# Patient Record
Sex: Female | Born: 1965 | Race: White | Hispanic: No | Marital: Married | Smoking: Former smoker
Health system: Southern US, Community
[De-identification: ages and names within clinical notes are randomized; demographics above are authoritative.]

## PROBLEM LIST (undated history)

## (undated) DIAGNOSIS — T7840XA Allergy, unspecified, initial encounter: Secondary | ICD-10-CM

## (undated) DIAGNOSIS — K219 Gastro-esophageal reflux disease without esophagitis: Secondary | ICD-10-CM

## (undated) DIAGNOSIS — T148XXA Other injury of unspecified body region, initial encounter: Secondary | ICD-10-CM

## (undated) DIAGNOSIS — B009 Herpesviral infection, unspecified: Secondary | ICD-10-CM

## (undated) DIAGNOSIS — B029 Zoster without complications: Secondary | ICD-10-CM

## (undated) DIAGNOSIS — U071 COVID-19: Secondary | ICD-10-CM

## (undated) DIAGNOSIS — R569 Unspecified convulsions: Secondary | ICD-10-CM

## (undated) HISTORY — DX: Zoster without complications: B02.9

## (undated) HISTORY — DX: Herpesviral infection, unspecified: B00.9

## (undated) HISTORY — DX: Other injury of unspecified body region, initial encounter: T14.8XXA

## (undated) HISTORY — DX: Gastro-esophageal reflux disease without esophagitis: K21.9

## (undated) HISTORY — PX: ABDOMINOPLASTY: SUR9

## (undated) HISTORY — DX: Unspecified convulsions: R56.9

## (undated) HISTORY — DX: COVID-19: U07.1

## (undated) HISTORY — DX: Allergy, unspecified, initial encounter: T78.40XA

## (undated) HISTORY — PX: FOOT SURGERY: SHX648

---

## 1993-09-06 HISTORY — PX: LAPAROSCOPIC CHOLECYSTECTOMY: SUR755

## 1999-04-27 ENCOUNTER — Other Ambulatory Visit: Admission: RE | Admit: 1999-04-27 | Discharge: 1999-04-27 | Payer: Self-pay | Admitting: Gynecology

## 2001-09-06 HISTORY — PX: AUGMENTATION MAMMAPLASTY: SUR837

## 2001-09-13 ENCOUNTER — Other Ambulatory Visit: Admission: RE | Admit: 2001-09-13 | Discharge: 2001-09-13 | Payer: Self-pay | Admitting: Gynecology

## 2002-09-14 ENCOUNTER — Other Ambulatory Visit: Admission: RE | Admit: 2002-09-14 | Discharge: 2002-09-14 | Payer: Self-pay | Admitting: Gynecology

## 2002-11-07 ENCOUNTER — Emergency Department (HOSPITAL_COMMUNITY): Admission: EM | Admit: 2002-11-07 | Discharge: 2002-11-07 | Payer: Self-pay | Admitting: Emergency Medicine

## 2002-11-07 ENCOUNTER — Encounter: Payer: Self-pay | Admitting: Emergency Medicine

## 2003-09-07 HISTORY — PX: ORIF ANKLE FRACTURE: SUR919

## 2004-07-07 ENCOUNTER — Other Ambulatory Visit: Admission: RE | Admit: 2004-07-07 | Discharge: 2004-07-07 | Payer: Self-pay | Admitting: Gynecology

## 2004-10-11 ENCOUNTER — Ambulatory Visit (HOSPITAL_BASED_OUTPATIENT_CLINIC_OR_DEPARTMENT_OTHER): Admission: RE | Admit: 2004-10-11 | Discharge: 2004-10-11 | Payer: Self-pay | Admitting: Family Medicine

## 2004-10-11 ENCOUNTER — Ambulatory Visit: Payer: Self-pay | Admitting: Internal Medicine

## 2005-07-21 ENCOUNTER — Other Ambulatory Visit: Admission: RE | Admit: 2005-07-21 | Discharge: 2005-07-21 | Payer: Self-pay | Admitting: Gynecology

## 2006-08-15 ENCOUNTER — Other Ambulatory Visit: Admission: RE | Admit: 2006-08-15 | Discharge: 2006-08-15 | Payer: Self-pay | Admitting: Gynecology

## 2008-09-10 ENCOUNTER — Ambulatory Visit: Payer: Self-pay | Admitting: Gynecology

## 2008-09-10 ENCOUNTER — Encounter: Payer: Self-pay | Admitting: Gynecology

## 2008-09-10 ENCOUNTER — Other Ambulatory Visit: Admission: RE | Admit: 2008-09-10 | Discharge: 2008-09-10 | Payer: Self-pay | Admitting: Gynecology

## 2008-09-18 ENCOUNTER — Ambulatory Visit: Payer: Self-pay | Admitting: Gynecology

## 2008-09-24 ENCOUNTER — Ambulatory Visit: Payer: Self-pay | Admitting: Gynecology

## 2008-10-29 ENCOUNTER — Ambulatory Visit: Payer: Self-pay | Admitting: Gynecology

## 2010-06-23 ENCOUNTER — Other Ambulatory Visit: Admission: RE | Admit: 2010-06-23 | Discharge: 2010-06-23 | Payer: Self-pay | Admitting: Gynecology

## 2010-06-23 ENCOUNTER — Ambulatory Visit: Payer: Self-pay | Admitting: Gynecology

## 2010-09-07 ENCOUNTER — Ambulatory Visit
Admission: RE | Admit: 2010-09-07 | Discharge: 2010-09-07 | Payer: Self-pay | Source: Home / Self Care | Attending: Gynecology | Admitting: Gynecology

## 2010-09-09 ENCOUNTER — Ambulatory Visit
Admission: RE | Admit: 2010-09-09 | Discharge: 2010-09-09 | Payer: Self-pay | Source: Home / Self Care | Attending: Gynecology | Admitting: Gynecology

## 2010-09-11 ENCOUNTER — Ambulatory Visit
Admission: RE | Admit: 2010-09-11 | Discharge: 2010-09-11 | Payer: Self-pay | Source: Home / Self Care | Attending: Gynecology | Admitting: Gynecology

## 2010-09-17 ENCOUNTER — Ambulatory Visit: Admit: 2010-09-17 | Payer: Self-pay | Admitting: Gynecology

## 2010-10-12 ENCOUNTER — Ambulatory Visit: Payer: Self-pay | Admitting: Gynecology

## 2010-10-23 ENCOUNTER — Ambulatory Visit: Payer: Self-pay | Admitting: Gynecology

## 2011-01-22 NOTE — Procedures (Signed)
NAME:  Alexandra Coffey, Alexandra Coffey              ACCOUNT NO.:  [id number]   MEDICAL RECORD NO.:  [id number]          PATIENT TYPE:  OUT   LOCATION:  SLEEP CENTER                 FACILITY:  [facility]   PHYSICIAN:  Clinton D. Maple Hudson, M.D. DATE OF BIRTH:  30-Jan-1966   DATE OF STUDY:  10/11/2004                              NOCTURNAL POLYSOMNOGRAM   STUDY DATE:  October 11, 2004   REFERRING PHYSICIAN:  Quita Skye. Artis Flock, M.D.   INDICATION FOR STUDY:  Insomnia with sleep apnea.   EPWORTH SLEEPINESS SCORE:  1/0.   BMI:  24.   WEIGHT:  145 pounds.   SLEEP ARCHITECTURE:  Short total sleep time 256 minutes with sleep  efficiency 69%.  Stage I 16%, stage II 52%, stages III and IV 20%.  REM was  13% of total sleep time.  Sleep latency 16 minutes, REM latency 180 minutes.  Awake after sleep onset was 101 minutes.  Arousal index 32.  The patient  took trazodone and Singulair at bedtime.  No increased slow wave/delta sleep  stages III and IV.  It is unusual in an adult and may reflect rebound sleep  deficiency.  She indicated this night of sleep quality same as usual  indicating habitual expectations to sleep 4 to 5 hours.   RESPIRATORY DATA:  Respiratory disturbance index (RDI) 11.5 per hour  indicating mild obstructive sleep apnea/hypopnea syndrome.  This included 1  obstructive apnea and 48 hypopnea's.  Events were not positional.  REM RDI  was absent.   OXYGEN DATA:  Mild snoring with oxygen desaturation to a nadir of 85%.  Mean  oxygen saturation through the study was 97% on room air.   CARDIAC DATA:  Normal sinus rhythm.   MOVEMENTS/PARASOMNIA:  Occasional leg jerk with insignificant effect on  sleep.   IMPRESSION/RECOMMENDATION:  1.  Mild obstructive sleep apnea/hypopnea syndrome, RDI 11.5 per hour with      oxygen desaturation to 85%.  This may deserve to be treated and a trial      of CPAP would be considered if weight loss, attention to nasal      congestion, and sleep position on flat of her  back are not sufficient.      It is not clear that this mild degree of sleep apnea significantly      explains the patient's difficulty initiating and maintaining sleep which      is more consistent with a primary insomnia.  Does sleep history offer      evidence of unusual anxiety, depression or      lifestyle issue that would impact on sleep quality?  Successful medical      therapy may combine an antidepressant and a hypnotic medication in      addition for the treatment for sleep apnea.      CDY/MEDQ  D:  10/25/2004 10:27:28  T:  10/25/2004 16:10:96  Job:  [id number]

## 2011-03-01 ENCOUNTER — Ambulatory Visit (INDEPENDENT_AMBULATORY_CARE_PROVIDER_SITE_OTHER): Payer: Managed Care, Other (non HMO) | Admitting: Gynecology

## 2011-03-01 DIAGNOSIS — N390 Urinary tract infection, site not specified: Secondary | ICD-10-CM

## 2011-03-01 DIAGNOSIS — R82998 Other abnormal findings in urine: Secondary | ICD-10-CM

## 2011-03-01 DIAGNOSIS — R350 Frequency of micturition: Secondary | ICD-10-CM

## 2012-03-08 ENCOUNTER — Encounter: Payer: Self-pay | Admitting: Gynecology

## 2012-03-08 ENCOUNTER — Ambulatory Visit (INDEPENDENT_AMBULATORY_CARE_PROVIDER_SITE_OTHER): Payer: 59 | Admitting: Gynecology

## 2012-03-08 VITALS — BP 126/80 | Ht 65.5 in | Wt 158.0 lb

## 2012-03-08 DIAGNOSIS — R635 Abnormal weight gain: Secondary | ICD-10-CM

## 2012-03-08 DIAGNOSIS — Z01419 Encounter for gynecological examination (general) (routine) without abnormal findings: Secondary | ICD-10-CM

## 2012-03-08 DIAGNOSIS — Z30432 Encounter for removal of intrauterine contraceptive device: Secondary | ICD-10-CM

## 2012-03-08 DIAGNOSIS — N951 Menopausal and female climacteric states: Secondary | ICD-10-CM

## 2012-03-08 DIAGNOSIS — Z833 Family history of diabetes mellitus: Secondary | ICD-10-CM

## 2012-03-08 LAB — CBC WITH DIFFERENTIAL/PLATELET
Basophils Absolute: 0 10*3/uL (ref 0.0–0.1)
Basophils Relative: 1 % (ref 0–1)
MCHC: 34 g/dL (ref 30.0–36.0)
Neutro Abs: 4.5 10*3/uL (ref 1.7–7.7)
Neutrophils Relative %: 57 % (ref 43–77)
Platelets: 215 10*3/uL (ref 150–400)
RDW: 13.7 % (ref 11.5–15.5)

## 2012-03-08 NOTE — Progress Notes (Signed)
Alexandra Coffey 1966/04/21 [id number]   History:    46 y.o.  for annual gyn exam with a complaint of weight gain and wanted to have her Mirena IUD removed. The Mirena IUD was placed in 2012. She's also having some mild vasomotor symptoms. Her last gynecological examination was in 2011. Patient's last mammogram was 8 years ago. Patient does her monthly self breast examination. Last year she was weighing 164 and is down to 158. Her BMI is 25.89. Patient works out regularly.  Past medical history,surgical history, family history and social history were all reviewed and documented in the EPIC chart.  Gynecologic History No LMP recorded. Patient is not currently having periods (Reason: IUD). Contraception: condoms and IUD Last Pap: 2011. Results were: normal Last mammogram: 8 years ago. Results were: normal  Obstetric History OB History    Grav Para Term Preterm Abortions TAB SAB Ect Mult Living   2 2 2       2      # Outc Date GA Lbr Len/2nd Wgt Sex Del Anes PTL Lv   1 TRM     F CS  No Yes   2 TRM     F CS  No Yes       ROS: A ROS was performed and pertinent positives and negatives are included in the history.  GENERAL: No fevers or chills. HEENT: No change in vision, no earache, sore throat or sinus congestion. NECK: No pain or stiffness. CARDIOVASCULAR: No chest pain or pressure. No palpitations. PULMONARY: No shortness of breath, cough or wheeze. GASTROINTESTINAL: No abdominal pain, nausea, vomiting or diarrhea, melena or bright red blood per rectum. GENITOURINARY: No urinary frequency, urgency, hesitancy or dysuria. MUSCULOSKELETAL: No joint or muscle pain, no back pain, no recent trauma. DERMATOLOGIC: No rash, no itching, no lesions. ENDOCRINE: No polyuria, polydipsia, no heat or cold intolerance. No recent change in weight. HEMATOLOGICAL: No anemia or easy bruising or bleeding. NEUROLOGIC: No headache, seizures, numbness, tingling or weakness. PSYCHIATRIC: No depression, no loss of interest  in normal activity or change in sleep pattern.     Exam: chaperone present  BP 126/80  Ht 5' 5.5" (1.664 m)  Wt 158 lb (71.668 kg)  BMI 25.89 kg/m2  Body mass index is 25.89 kg/(m^2).  General appearance : Well developed well nourished female. No acute distress HEENT: Neck supple, trachea midline, no carotid bruits, no thyroidmegaly Lungs: Clear to auscultation, no rhonchi or wheezes, or rib retractions  Heart: Regular rate and rhythm, no murmurs or gallops Breast:Examined in sitting and supine position were symmetrical in appearance, no palpable masses or tenderness,  no skin retraction, no nipple inversion, no nipple discharge, no skin discoloration, no axillary or supraclavicular lymphadenopathy Abdomen: no palpable masses or tenderness, no rebound or guarding Extremities: no edema or skin discoloration or tenderness  Pelvic:  Bartholin, Urethra, Skene Glands: Within normal limits             Vagina: No gross lesions or discharge  Cervix: No gross lesions or discharge, IUD string seen  Uterus  anteverted, normal size, shape and consistency, non-tender and mobile  Adnexa  Without masses or tenderness  Anus and perineum  normal   Rectovaginal  normal sphincter tone without palpated masses or tenderness             Hemoccult not done   The cervix was cleansed with Betadine solution and a [place] clamp was utilized to retrieve the IUD by pulling on the string showing it to  the patient and discarded.  Assessment/Plan:  46 y.o. female for annual exam was unremarkable exam. Due to patient's family history diabetes a hemoglobin A1c will be done today. As a result of her vasomotor symptoms an FSH will be drawn today along with a TSH because of concerns of weight gain as well. We'll also check her CBC as well as a screening cholesterol and urinalysis. No Pap smear done today. New screening guidelines discussed. Patient was provided with a requisition to schedule her  mammogram.    Ok Edwards MD, 5:26 PM 03/08/2012

## 2012-03-08 NOTE — Patient Instructions (Addendum)
Health Maintenance, Females A healthy lifestyle and preventative care can promote health and wellness.  Maintain regular health, dental, and eye exams.   Eat a healthy diet. Foods like vegetables, fruits, whole grains, low-fat dairy products, and lean protein foods contain the nutrients you need without too many calories. Decrease your intake of foods high in solid fats, added sugars, and salt. Get information about a proper diet from your caregiver, if necessary.   Regular physical exercise is one of the most important things you can do for your health. Most adults should get at least 150 minutes of moderate-intensity exercise (any activity that increases your heart rate and causes you to sweat) each week. In addition, most adults need muscle-strengthening exercises on 2 or more days a week.    Maintain a healthy weight. The body mass index (BMI) is a screening tool to identify possible weight problems. It provides an estimate of body fat based on height and weight. Your caregiver can help determine your BMI, and can help you achieve or maintain a healthy weight. For adults 20 years and older:   A BMI below 18.5 is considered underweight.   A BMI of 18.5 to 24.9 is normal.   A BMI of 25 to 29.9 is considered overweight.   A BMI of 30 and above is considered obese.   Maintain normal blood lipids and cholesterol by exercising and minimizing your intake of saturated fat. Eat a balanced diet with plenty of fruits and vegetables. Blood tests for lipids and cholesterol should begin at age 20 and be repeated every 5 years. If your lipid or cholesterol levels are high, you are over 50, or you are a high risk for heart disease, you may need your cholesterol levels checked more frequently.Ongoing high lipid and cholesterol levels should be treated with medicines if diet and exercise are not effective.   If you smoke, find out from your caregiver how to quit. If you do not use tobacco, do not start.    If you are pregnant, do not drink alcohol. If you are breastfeeding, be very cautious about drinking alcohol. If you are not pregnant and choose to drink alcohol, do not exceed 1 drink per day. One drink is considered to be 12 ounces (355 mL) of beer, 5 ounces (148 mL) of wine, or 1.5 ounces (44 mL) of liquor.   Avoid use of street drugs. Do not share needles with anyone. Ask for help if you need support or instructions about stopping the use of drugs.   High blood pressure causes heart disease and increases the risk of stroke. Blood pressure should be checked at least every 1 to 2 years. Ongoing high blood pressure should be treated with medicines, if weight loss and exercise are not effective.   If you are 55 to 48 years old, ask your caregiver if you should take aspirin to prevent strokes.   Diabetes screening involves taking a blood sample to check your fasting blood sugar level. This should be done once every 3 years, after age 45, if you are within normal weight and without risk factors for diabetes. Testing should be considered at a younger age or be carried out more frequently if you are overweight and have at least 1 risk factor for diabetes.   Breast cancer screening is essential preventative care for women. You should practice "breast self-awareness." This means understanding the normal appearance and feel of your breasts and may include breast self-examination. Any changes detected, no matter how   small, should be reported to a caregiver. Women in their 20s and 30s should have a clinical breast exam (CBE) by a caregiver as part of a regular health exam every 1 to 3 years. After age 40, women should have a CBE every year. Starting at age 40, women should consider having a mammogram (breast X-ray) every year. Women who have a family history of breast cancer should talk to their caregiver about genetic screening. Women at a high risk of breast cancer should talk to their caregiver about having  an MRI and a mammogram every year.   The Pap test is a screening test for cervical cancer. Women should have a Pap test starting at age 21. Between ages 21 and 29, Pap tests should be repeated every 2 years. Beginning at age 30, you should have a Pap test every 3 years as long as the past 3 Pap tests have been normal. If you had a hysterectomy for a problem that was not cancer or a condition that could lead to cancer, then you no longer need Pap tests. If you are between ages 65 and 70, and you have had normal Pap tests going back 10 years, you no longer need Pap tests. If you have had past treatment for cervical cancer or a condition that could lead to cancer, you need Pap tests and screening for cancer for at least 20 years after your treatment. If Pap tests have been discontinued, risk factors (such as a new sexual partner) need to be reassessed to determine if screening should be resumed. Some women have medical problems that increase the chance of getting cervical cancer. In these cases, your caregiver may recommend more frequent screening and Pap tests.   The human papillomavirus (HPV) test is an additional test that may be used for cervical cancer screening. The HPV test looks for the virus that can cause the cell changes on the cervix. The cells collected during the Pap test can be tested for HPV. The HPV test could be used to screen women aged 30 years and older, and should be used in women of any age who have unclear Pap test results. After the age of 30, women should have HPV testing at the same frequency as a Pap test.   Colorectal cancer can be detected and often prevented. Most routine colorectal cancer screening begins at the age of 50 and continues through age 75. However, your caregiver may recommend screening at an earlier age if you have risk factors for colon cancer. On a yearly basis, your caregiver may provide home test kits to check for hidden blood in the stool. Use of a small camera at  the end of a tube, to directly examine the colon (sigmoidoscopy or colonoscopy), can detect the earliest forms of colorectal cancer. Talk to your caregiver about this at age 50, when routine screening begins. Direct examination of the colon should be repeated every 5 to 10 years through age 75, unless early forms of pre-cancerous polyps or small growths are found.   Hepatitis C blood testing is recommended for all people born from 1945 through 1965 and any individual with known risks for hepatitis C.   Practice safe sex. Use condoms and avoid high-risk sexual practices to reduce the spread of sexually transmitted infections (STIs). Sexually active women aged 25 and younger should be checked for Chlamydia, which is a common sexually transmitted infection. Older women with new or multiple partners should also be tested for Chlamydia. Testing for other   STIs is recommended if you are sexually active and at increased risk.   Osteoporosis is a disease in which the bones lose minerals and strength with aging. This can result in serious bone fractures. The risk of osteoporosis can be identified using a bone density scan. Women ages 21 and over and women at risk for fractures or osteoporosis should discuss screening with their caregivers. Ask your caregiver whether you should be taking a calcium supplement or vitamin D to reduce the rate of osteoporosis.   Menopause can be associated with physical symptoms and risks. Hormone replacement therapy is available to decrease symptoms and risks. You should talk to your caregiver about whether hormone replacement therapy is right for you.   Use sunscreen with a sun protection factor (SPF) of 30 or greater. Apply sunscreen liberally and repeatedly throughout the day. You should seek shade when your shadow is shorter than you. Protect yourself by wearing long sleeves, pants, a wide-brimmed hat, and sunglasses year round, whenever you are outdoors.   Notify your caregiver  of new moles or changes in moles, especially if there is a change in shape or color. Also notify your caregiver if a mole is larger than the size of a pencil eraser.   Stay current with your immunizations.  Document Released: 03/08/2011 Document Revised: 08/12/2011 Document Reviewed: 03/08/2011 [facility] Patient Information 2012 [place], [place].  Perimenopause Perimenopause is the time when your body begins to move into the menopause (no menstrual period for 12 straight months). It is a natural process. Perimenopause can begin 2 to 8 years before the menopause and usually lasts for one year after the menopause. During this time, your ovaries may or may not produce an egg. The ovaries vary in their production of estrogen and progesterone hormones each month. This can cause irregular menstrual periods, difficulty in getting pregnant, vaginal bleeding between periods and uncomfortable symptoms. CAUSES  Irregular production of the ovarian hormones, estrogen and progesterone, and not ovulating every month.   Other causes include:   Tumor of the pituitary gland in the brain.   Medical disease that affects the ovaries.   Radiation treatment.   Chemotherapy.   Unknown causes.   Heavy smoking and excessive alcohol intake can bring on perimenopause sooner.  SYMPTOMS   Hot flashes.   Night sweats.   Irregular menstrual periods.   Decrease sex drive.   Vaginal dryness.   Headaches.   Mood swings.   Depression.   Memory problems.   Irritability.   Tiredness.   Weight gain.   Trouble getting pregnant.   The beginning of losing bone cells (osteoporosis).   The beginning of hardening of the arteries (atherosclerosis).  DIAGNOSIS  Your caregiver will make a diagnosis by analyzing your age, menstrual history and your symptoms. They will do a physical exam noting any changes in your body, especially your female organs. Female hormone tests may or may not be helpful depending on  the amount and when you produce the female hormones. However, other hormone tests may be helpful (ex. thyroid hormone) to rule out other problems. TREATMENT  The decision to treat during the perimenopause should be made by you and your caregiver depending on how the symptoms are affecting you and your life style. There are various treatments available such as:  Treating individual symptoms with a specific medication for that symptom (ex. tranquilizer for depression).   Herbal medications that can help specific symptoms.   Counseling.   Group therapy.   No treatment.  HOME CARE INSTRUCTIONS   Before seeing your caregiver, make a list of your menstrual periods (when the occur, how heavy they are, how long between periods and how long they last), your symptoms and when they started.   Take the medication as recommended by your caregiver.   Sleep and rest.   Exercise.   Eat a diet that contains calcium (good for your bones) and soy (acts like estrogen hormone).   Do not smoke.   Avoid alcoholic beverages.   Taking vitamin E may help in certain cases.   Take calcium and vitamin D supplements to help prevent bone loss.   Group therapy is sometimes helpful.   Acupuncture may help in some cases.  SEEK MEDICAL CARE IF:   You have any of the above and want to know if it is perimenopause.   You want advice and treatment for any of your symptoms mentioned above.   You need a referral to a specialist (gynecologist, psychiatrist or psychologist).  SEEK IMMEDIATE MEDICAL CARE IF:   You have vaginal bleeding.   Your period lasts longer than 8 days.   You periods are recurring sooner than 21 days.   You have bleeding after intercourse.   You have severe depression.   You have pain when you urinate.   You have severe headaches.   You develop vision problems.  Document Released: 09/30/2004 Document Revised: 08/12/2011 Document Reviewed: 06/20/2008 [facility] Patient  Information 2012 [place], [place].

## 2012-03-09 LAB — HEMOGLOBIN A1C: Mean Plasma Glucose: 111 mg/dL (ref <117)

## 2012-03-09 LAB — FOLLICLE STIMULATING HORMONE: FSH: 41.5 m[IU]/mL

## 2012-03-10 LAB — URINALYSIS W MICROSCOPIC + REFLEX CULTURE
Bilirubin Urine: NEGATIVE
Casts: NONE SEEN
Crystals: NONE SEEN
Ketones, ur: NEGATIVE mg/dL
Specific Gravity, Urine: <1.005 — ABNORMAL LOW (ref 1.005–1.030)
Urobilinogen, UA: 0.2 mg/dL (ref 0.0–1.0)

## 2012-03-12 LAB — URINE CULTURE: Colony Count: 10000

## 2012-05-15 ENCOUNTER — Telehealth: Payer: Self-pay | Admitting: Gynecology

## 2012-05-15 NOTE — Telephone Encounter (Signed)
Patient needs an office appointment for consultation.

## 2012-05-15 NOTE — Telephone Encounter (Signed)
Patient said she had IUD removed in July because of weight gain.  She said that since then her weight has not dropped.  She said she has tried diet and exercise with no weight loss.  She is requesting prescription.  She said you gave her an RX in the past that she took for just a couple of months to "jump start my metabolism" and help her lose weight. Cannot wear any of her fall clothes.  She would like that again.  I did not see it in her e-chart or hardcopy chart.  (Hardcopy chart is in your inbox in the hall.)

## 2012-05-16 NOTE — Telephone Encounter (Signed)
Patient informed and consult scheduled.

## 2012-05-18 ENCOUNTER — Ambulatory Visit (INDEPENDENT_AMBULATORY_CARE_PROVIDER_SITE_OTHER): Payer: 59 | Admitting: Gynecology

## 2012-05-18 ENCOUNTER — Encounter: Payer: Self-pay | Admitting: Gynecology

## 2012-05-18 VITALS — BP 128/84 | Wt 165.0 lb

## 2012-05-18 DIAGNOSIS — N951 Menopausal and female climacteric states: Secondary | ICD-10-CM

## 2012-05-18 DIAGNOSIS — R635 Abnormal weight gain: Secondary | ICD-10-CM

## 2012-05-18 DIAGNOSIS — R6882 Decreased libido: Secondary | ICD-10-CM

## 2012-05-18 DIAGNOSIS — R232 Flushing: Secondary | ICD-10-CM

## 2012-05-18 DIAGNOSIS — Z780 Asymptomatic menopausal state: Secondary | ICD-10-CM

## 2012-05-18 MED ORDER — PHENTERMINE HCL 37.5 MG PO CAPS: 37.5000 mg | ORAL_CAPSULE | ORAL | Status: DC

## 2012-05-18 NOTE — Patient Instructions (Addendum)
Phentermine tablets or capsules  What is this medicine? PHENTERMINE (FEN ter meen) decreases your appetite. It is used with a reduced calorie diet and exercise to help you lose weight. This medicine may be used for other purposes; ask your health care provider or pharmacist if you have questions. What should I tell my health care provider before I take this medicine? They need to know if you have any of these conditions: -agitation -glaucoma -heart disease -high blood pressure -history of substance abuse -lung disease called Primary Pulmonary Hypertension (PPH) -taken an MAOI like Carbex, Eldepryl, Marplan, Nardil, or Parnate in last 14 days -thyroid disease -an unusual or allergic reaction to phentermine, other medicines, foods, dyes, or preservatives -pregnant or trying to get pregnant -breast-feeding How should I use this medicine? Take this medicine by mouth with a glass of water. Follow the directions on the prescription label. This medicine is usually taken 30 minutes before or 1 to 2 hours after breakfast. Avoid taking this medicine in the evening. It may interfere with sleep. Take your doses at regular intervals. Do not take your medicine more often than directed. Talk to your pediatrician regarding the use of this medicine in children. Special care may be needed. Overdosage: If you think you have taken too much of this medicine contact a poison control center or emergency room at once. NOTE: This medicine is only for you. Do not share this medicine with others. What if I miss a dose? If you miss a dose, take it as soon as you can. If it is almost time for your next dose, take only that dose. Do not take double or extra doses. What may interact with this medicine? Do not take this medicine with any of the following medications: -duloxetine -MAOIs like Carbex, Eldepryl, Marplan, Nardil, and Parnate -medicines for colds or breathing difficulties like pseudoephedrine or  phenylephrine -procarbazine -sibutramine -SSRIs like citalopram, escitalopram, fluoxetine, fluvoxamine, paroxetine, and sertraline -stimulants like dexmethylphenidate, methylphenidate or modafinil -venlafaxine This medicine may also interact with the following medications: -medicines for diabetes This list may not describe all possible interactions. Give your health care provider a list of all the medicines, herbs, non-prescription drugs, or dietary supplements you use. Also tell them if you smoke, drink alcohol, or use illegal drugs. Some items may interact with your medicine. What should I watch for while using this medicine? Notify your physician immediately if you become short of breath while doing your normal activities. Do not take this medicine within 6 hours of bedtime. It can keep you from getting to sleep. Avoid drinks that contain caffeine and try to stick to a regular bedtime every night. This medicine was intended to be used in addition to a healthy diet and exercise. The best results are achieved this way. This medicine is only indicated for short-term use. Eventually your weight loss may level out. At that point, the drug will only help you maintain your new weight. Do not increase or in any way change your dose without consulting your doctor. You may get drowsy or dizzy. Do not drive, use machinery, or do anything that needs mental alertness until you know how this medicine affects you. Do not stand or sit up quickly, especially if you are an older patient. This reduces the risk of dizzy or fainting spells. Alcohol may increase dizziness and drowsiness. Avoid alcoholic drinks. What side effects may I notice from receiving this medicine? Side effects that you should report to your doctor or health care professional as  soon as possible: -chest pain, palpitations -depression or severe changes in mood -increased blood pressure -irritability -nervousness or restlessness -severe  dizziness -shortness of breath -problems urinating -unusual swelling of the legs -vomiting Side effects that usually do not require medical attention (report to your doctor or health care professional if they continue or are bothersome): -blurred vision or other eye problems -changes in sexual ability or desire -constipation or diarrhea -difficulty sleeping -dry mouth or unpleasant taste -headache -nausea This list may not describe all possible side effects. Call your doctor for medical advice about side effects. You may report side effects to FDA at 1-800-FDA-1088. Where should I keep my medicine? Keep out of the reach of children. This medicine can be abused. Keep your medicine in a safe place to protect it from theft. Do not share this medicine with anyone. Selling or giving away this medicine is dangerous and against the law. Store at room temperature between 20 and 25 degrees C (68 and 77 degrees F). Keep container tightly closed. Throw away any unused medicine after the expiration date. NOTE: This sheet is a summary. It may not cover all possible information. If you have questions about this medicine, talk to your doctor, pharmacist, or health care provider.  2012, [employer]. (10/07/2010 11:02:44 AM)  Menopause Menopause is the normal time of life when menstrual periods stop completely. Menopause is complete when you have missed 12 consecutive menstrual periods. It usually occurs between the ages of 88 to 100, with an average age of 54. Very rarely does a woman develop menopause before 48 years old. At menopause, your ovaries stop producing the female hormones, estrogen and progesterone. This can cause undesirable symptoms and also affect your health. Sometimes the symptoms may occur 4 to 5 years before the menopause begins. There is no relationship between menopause and:  Oral contraceptives.   Number of children you had.   Race.   The age your menstrual periods started  (menarche).  Heavy smokers and very thin women may develop menopause earlier in life. CAUSES  The ovaries stop producing the female hormones estrogen and progesterone.   Other causes include:   Surgery to remove both ovaries.   The ovaries stop functioning for no known reason.   Tumors of the pituitary gland in the brain.   Medical disease that affects the ovaries and hormone production.   Radiation treatment to the abdomen or pelvis.   Chemotherapy that affects the ovaries.  SYMPTOMS   Hot flashes.   Night sweats.   Decrease in sex drive.   Vaginal dryness and thinning of the vagina causing painful intercourse.   Dryness of the skin and developing wrinkles.   Headaches.   Tiredness.   Irritability.   Memory problems.   Weight gain.   Bladder infections.   Hair growth of the face and chest.   Infertility.  More serious symptoms include:  Loss of bone (osteoporosis) causing breaks (fractures).   Depression.   Hardening and narrowing of the arteries (atherosclerosis) causing heart attacks and strokes.  DIAGNOSIS   When the menstrual periods have stopped for 12 straight months.   Physical exam.   Hormone studies of the blood.  TREATMENT  There are many treatment choices and nearly as many questions about them. The decisions to treat or not to treat menopausal changes is an individual choice made with your caregiver. Your caregiver can discuss the treatments with you. Together, you can decide which treatment will work best for you. Your treatment  choices may include:   Hormone therapy (estorgen and progesterone).   Non-hormonal medications.   Treating the individual symptoms with medication (for example antidepressants for depression).   Herbal medications that may help specific symptoms.   Counseling by a psychiatrist or psychologist.   Group therapy.   Lifestyle changes including:   Eating healthy.   Regular exercise.   Limiting caffeine  and alcohol.   Stress management and meditation.   No treatment.  HOME CARE INSTRUCTIONS   Take the medication your caregiver gives you as directed.   Get plenty of sleep and rest.   Exercise regularly.   Eat a diet that contains calcium (good for the bones) and soy products (acts like estrogen hormone).   Avoid alcoholic beverages.   Do not smoke.   If you have hot flashes, dress in layers.   Take supplements, calcium and vitamin D to strengthen bones.   You can use over-the-counter lubricants or moisturizers for vaginal dryness.   Group therapy is sometimes very helpful.   Acupuncture may be helpful in some cases.  SEEK MEDICAL CARE IF:   You are not sure you are in menopause.   You are having menopausal symptoms and need advice and treatment.   You are still having menstrual periods after age 49.   You have pain with intercourse.   Menopause is complete (no menstrual period for 12 months) and you develop vaginal bleeding.   You need a referral to a specialist (gynecologist, psychiatrist or psychologist) for treatment.  SEEK IMMEDIATE MEDICAL CARE IF:   You have severe depression.   You have excessive vaginal bleeding.   You fell and think you have a broken bone.   You have pain when you urinate.   You develop leg or chest pain.   You have a fast pounding heart beat (palpitations).   You have severe headaches.   You develop vision problems.   You feel a lump in your breast.   You have abdominal pain or severe indigestion.  Document Released: 11/13/2003 Document Revised: 08/12/2011 Document Reviewed: 06/20/2008 [facility] Patient Information 2012 [place], [place].  Hormone Therapy At menopause, your body begins making less estrogen and progesterone hormones. This causes the body to stop having menstrual periods. This is because estrogen and progesterone hormones control your periods and menstrual cycle. A lack of estrogen may cause symptoms such  as:  Hot flushes (or hot flashes).   Vaginal dryness.   Dry skin.   Loss of sex drive.   Risk of bone loss (osteoporosis).  When this happens, you may choose to take hormone therapy to get back the estrogen lost during menopause. When the hormone estrogen is given alone, it is usually referred to as ET (Estrogen Therapy). When the hormone progestin is combined with estrogen, it is generally called HT (Hormone Therapy). This was formerly known as hormone replacement therapy (HRT). Your caregiver can help you make a decision on what will be best for you. The decision to use HT seems to change often as new studies are done. Many studies do not agree on the benefits of hormone replacement therapy. LIKELY BENEFITS OF HT INCLUDE PROTECTION FROM:  Hot Flushes (also called hot flashes) - A hot flush is a sudden feeling of heat that spreads over the face and body. The skin may redden like a blush. It is connected with sweats and sleep disturbance. Women going through menopause may have hot flushes a few times a month or several times per day depending  on the woman.   Osteoporosis (bone loss)- Estrogen helps guard against bone loss. After menopause, a woman's bones slowly lose calcium and become weak and brittle. As a result, bones are more likely to break. The hip, wrist, and spine are affected most often. Hormone therapy can help slow bone loss after menopause. Weight bearing exercise and taking calcium with vitamin D also can help prevent bone loss. There are also medications that your caregiver can prescribe that can help prevent osteoporosis.   Vaginal Dryness - Loss of estrogen causes changes in the vagina. Its lining may become thin and dry. These changes can cause pain and bleeding during sexual intercourse. Dryness can also lead to infections. This can cause burning and itching. (Vaginal estrogen treatment can help relieve pain, itching, and dryness.)   Urinary Tract Infections are more common  after menopause because of lack of estrogen. Some women also develop urinary incontinence because of low estrogen levels in the vagina and bladder.   Possible other benefits of estrogen include a positive effect on mood and short-term memory in women.  RISKS AND COMPLICATIONS  Using estrogen alone without progesterone causes the lining of the uterus to grow. This increases the risk of lining of the uterus (endometrial) cancer. Your caregiver should give another hormone called progestin if you have a uterus.   Women who take combined (estrogen and progestin) HT appear to have an increased risk of breast cancer. The risk appears to be small, but increases throughout the time that HT is taken.   Combined therapy also makes the breast tissue slightly denser which makes it harder to read mammograms (breast X-rays).   Combined, estrogen and progesterone therapy can be taken together every day, in which case there may be spotting of blood. HT therapy can be taken cyclically in which case you will have menstrual periods. Cyclically means HT is taken for a set amount of days, then not taken, then this process is repeated.   HT may increase the risk of stroke, heart attack, breast cancer and forming blood clots in your leg.   Transdermal estrogen (estrogen that is absorbed through the skin with a patch or a cream) may have more positive results with:   Cholesterol.   Blood pressure.   Blood clots.  Having the following conditions may indicate you should not have HT:  Endometrial cancer.   Liver disease.   Breast cancer.   Heart disease.   History of blood clots.   Stroke.  TREATMENT   If you choose to take HT and have a uterus, usually estrogen and progestin are prescribed.   Your caregiver will help you decide the best way to take the medications.   Possible ways to take estrogen include:   Pills.   Patches.   Gels.   Sprays.   Vaginal estrogen cream, rings and tablets.    It is best to take the lowest dose possible that will help your symptoms and take them for the shortest period of time that you can.   Hormone therapy can help relieve some of the problems (symptoms) that affect women at menopause. Before making a decision about HT, talk to your caregiver about what is best for you. Be well informed and comfortable with your decisions.  HOME CARE INSTRUCTIONS   Follow your caregivers advice when taking the medications.   A Pap test is done to screen for cervical cancer.   The first Pap test should be done at age 65.   Between  ages 45 and 50, Pap tests are repeated every 2 years.   Beginning at age 42, you are advised to have a Pap test every 3 years as long as your past 3 Pap tests have been normal.   Some women have medical problems that increase the chance of getting cervical cancer. Talk to your caregiver about these problems. It is especially important to talk to your caregiver if a new problem develops soon after your last Pap test. In these cases, your caregiver may recommend more frequent screening and Pap tests.   The above recommendations are the same for women who have or have not gotten the vaccine for HPV (Human Papillomavirus).   If you had a hysterectomy for a problem that was not a cancer or a condition that could lead to cancer, then you no longer need Pap tests. However, even if you no longer need a Pap test, a regular exam is a good idea to make sure no other problems are starting.    If you are between ages 32 and 69, and you have had normal Pap tests going back 10 years, you no longer need Pap tests. However, even if you no longer need a Pap test, a regular exam is a good idea to make sure no other problems are starting.    If you have had past treatment for cervical cancer or a condition that could lead to cancer, you need Pap tests and screening for cancer for at least 20 years after your treatment.   If Pap tests have been  discontinued, risk factors (such as a new sexual partner) need to be re-assessed to determine if screening should be resumed.   Some women may need screenings more often if they are at high risk for cervical cancer.   Get mammograms done as per the advice of your caregiver.  SEEK IMMEDIATE MEDICAL CARE IF:  You develop abnormal vaginal bleeding.   You have pain or swelling in your legs, shortness of breath, or chest pain.   You develop dizziness or headaches.   You have lumps or changes in your breasts or armpits.   You have slurred speech.   You develop weakness or numbness of your arms or legs.   You have pain, burning, or bleeding when urinating.   You develop abdominal pain.  Document Released: 05/22/2003 Document Revised: 08/12/2011 Document Reviewed: 09/09/2010 [facility] Patient Information 2012 [place], [place].

## 2012-05-18 NOTE — Progress Notes (Addendum)
Patient is a 46 year old gravida 2 para 2 who was seen in the office in July 3 for her annual gynecological examination and was requesting to remove her Mirena IUD. She thought that this is causing her to gaining weight. She was having mild vasomotor symptoms. The IUD was removed and she presented today stating that she has continued to gain weight whereby on the last office visit in July she was weighing 158 pounds and today's 165 pounds. She exercises frequently with a trainer and has also seen a nutritionist.  Her last labs included the following: Blood sugar, total cholesterol, CBC, TSH, hemoglobin A1c which were all normal. She was treated for urinary tract infection. Her FSH was found to be elevated at 41.5.  She has stated that she started to experience vasomotor symptoms and has had decreased libido and has been irritable at times. Several years ago she had been placed on phentermine 37.5 mg daily for 3 months along with exercise and diet and lost weight. She would like to return back to the phentermine for 3 months.  Exam: Neck: No thyromegaly no carotid bruits Lungs: Clear to auscultation Rogers or wheezes Heart: Regular rate and rhythm no murmurs or gallops  Assessment/plan: Patient anxious because despite exercise and diet she has continued to gain weight despite all normal labs noted above. She would like to go on phentermine for 3 months to lose weight. Her BMI was 27.04 (165 pounds). We went through a lengthy discussion on potential side effects of phentermine to include the following: Cardiac ischemia, tachycardia, valvular heart disease, hypertension, pulmonary hypertension were discussed. She fully understands and accepts. She'll be started on 37.5 mg one tablet by mouth daily for 3 months only. She'll return to the office monthly for blood pressure checks and cardiac and pulmonary auscultation. If she develops any the above symptoms she should report to the office or the emergency room  immediately. She states that her vasomotor symptoms are not that bad she wants to wait. We discussed alternatives to hormone replacement therapy such as SSRI and she will let us know. Literature information on the menopause and hormone replacement therapy as well as on phentermine were provided. When she returned back for office visit she will receive the dTap Vaccine since she does not recall having had one. Since patient's last menstrual period was in August and has not had any intercourse I did explain to her that being perimenopausal she could get pregnant and when she does have intercourse to use condoms.

## 2012-05-24 ENCOUNTER — Encounter: Payer: Self-pay | Admitting: Gynecology

## 2012-06-16 ENCOUNTER — Encounter: Payer: Self-pay | Admitting: Gynecology

## 2012-06-16 ENCOUNTER — Ambulatory Visit (INDEPENDENT_AMBULATORY_CARE_PROVIDER_SITE_OTHER): Payer: 59 | Admitting: Gynecology

## 2012-06-16 VITALS — BP 118/72 | Wt 161.0 lb

## 2012-06-16 DIAGNOSIS — R635 Abnormal weight gain: Secondary | ICD-10-CM

## 2012-06-16 NOTE — Progress Notes (Signed)
Patient is a 46 year old gravida 2 para 2 who was seen in the office in July 3 for her annual gynecological examination and was requesting to remove her Mirena IUD. She thought that this is causing her to gaining weight. She was having mild vasomotor symptoms. The IUD was removed and she presented today stating that she has continued to gain weight whereby on the last office visit in July she was weighing 158 pounds and today's 165 pounds. She exercises frequently with a trainer and has also seen a nutritionist.   Her last labs included the following: Blood sugar, total cholesterol, CBC, TSH, hemoglobin A1c which were all normal. She was treated for urinary tract infection. Her FSH was found to be elevated at 41.5.  She has stated that she started to experience vasomotor symptoms and has had decreased libido and has been irritable at times. Several years ago she had been placed on phentermine 37.5 mg daily for 3 months along with exercise and diet and lost weight. She would like to return back to the phentermine for 3 months. On September 12 she was placed on phentermine 37.5 mg 1 by mouth daily for 3 months. She presented today for followup visit for cardiac auscultation as well as blood pressure reading and wait measurement. We had gone through a lengthy discussion on potential side effects of phentermine to include the following: Cardiac ischemia, tachycardia, valvular heart disease, hypertension, pulmonary hypertension and she freely accepted these risk for a short term treatment.  She was weighing 165 pounds on July 12 today she is down to 161 pounds. She is totally asymptomatic and is continuing to work out and exercise regularly.  Exam: Blood pressure 118/72 heart rate 70 respiration 12 HEENT: Unremarkable Neck: Supple trachea midline no carotid bruits no thyromegaly Lungs: Clear to auscultation Rogers or wheezes Heart: Regular rate and rhythm no murmurs or gallops  Patient will continue on the  phentermine for 2 more months. Once again the risk route line she fully except. She will continue with her good nutrition and regular exercise. We'll see her back in 2 months for final followup. She was reminded to contact the office or report to the emergency room if she has any symptoms whatsoever such as shortness of breath or chest pain.  Patient anxious because despite exercise and diet she has continued to gain weight despite all normal labs,

## 2013-03-29 ENCOUNTER — Other Ambulatory Visit (HOSPITAL_COMMUNITY)
Admission: RE | Admit: 2013-03-29 | Discharge: 2013-03-29 | Disposition: A | Discharge status: Home / Self Care | Payer: 59 | Source: Ambulatory Visit | Attending: Gynecology | Admitting: Gynecology

## 2013-03-29 ENCOUNTER — Ambulatory Visit (INDEPENDENT_AMBULATORY_CARE_PROVIDER_SITE_OTHER): Payer: 59 | Admitting: Gynecology

## 2013-03-29 ENCOUNTER — Encounter: Payer: Self-pay | Admitting: Gynecology

## 2013-03-29 VITALS — BP 128/78 | Ht 65.5 in | Wt 173.0 lb

## 2013-03-29 DIAGNOSIS — R635 Abnormal weight gain: Secondary | ICD-10-CM

## 2013-03-29 DIAGNOSIS — Z1151 Encounter for screening for human papillomavirus (HPV): Secondary | ICD-10-CM | POA: Insufficient documentation

## 2013-03-29 DIAGNOSIS — Z01419 Encounter for gynecological examination (general) (routine) without abnormal findings: Secondary | ICD-10-CM | POA: Insufficient documentation

## 2013-03-29 DIAGNOSIS — Z780 Asymptomatic menopausal state: Secondary | ICD-10-CM

## 2013-03-29 DIAGNOSIS — Z23 Encounter for immunization: Secondary | ICD-10-CM

## 2013-03-29 DIAGNOSIS — R140 Abdominal distension (gaseous): Secondary | ICD-10-CM

## 2013-03-29 DIAGNOSIS — N951 Menopausal and female climacteric states: Secondary | ICD-10-CM

## 2013-03-29 DIAGNOSIS — R141 Gas pain: Secondary | ICD-10-CM

## 2013-03-29 DIAGNOSIS — Z1159 Encounter for screening for other viral diseases: Secondary | ICD-10-CM

## 2013-03-29 LAB — HEMOGLOBIN A1C
Hgb A1c MFr Bld: 5.2 % (ref <5.7)
Mean Plasma Glucose: 103 mg/dL (ref <117)

## 2013-03-29 LAB — COMPREHENSIVE METABOLIC PANEL
AST: 16 U/L (ref 0–37)
Albumin: 4.1 g/dL (ref 3.5–5.2)
Alkaline Phosphatase: 38 U/L — ABNORMAL LOW (ref 39–117)
BUN: 12 mg/dL (ref 6–23)
Potassium: 4.1 mEq/L (ref 3.5–5.3)

## 2013-03-29 LAB — THYROID PANEL WITH TSH
Free Thyroxine Index: 2.9 (ref 1.0–3.9)
TSH: 1.685 u[IU]/mL (ref 0.350–4.500)

## 2013-03-29 LAB — HEPATITIS C ANTIBODY: HCV Ab: NEGATIVE

## 2013-03-29 LAB — CHOLESTEROL, TOTAL: Cholesterol: 170 mg/dL (ref 0–200)

## 2013-03-29 NOTE — Addendum Note (Signed)
Addended by: Bertram Savin A on: 03/29/2013 02:13 PM   Modules accepted: Orders

## 2013-03-29 NOTE — Patient Instructions (Addendum)

## 2013-03-29 NOTE — Progress Notes (Signed)
Alexandra Coffey 17-Jul-1966 [id number]   History:    47 y.o.  for annual gyn exam whose only complaint has been weight gain. She saw her primary physician and she was started on Belviq which she takes twice a day to help lose weight which he started 2 weeks ago. No lab work was drawn. Review of her record indicated in 2013 her FSH was elevated but her vasomotor symptoms are very few and far and in between A. She is not want to go on any hormonal replacement therapy. Patient's last Pap smear was in 2011. Her last mammogram was in 2005 and she has not had a followup mammogram since then. She does do monthly breast examination. She was recently treated for a body rash By her primary physician. Patient has been complaining of bloating although her bowel movements are reported to be normal she denies any hematochezia. Review of her records indicated that she was weighing 158 pounds and is now up to 165 pounds. She exercises at least 4 times a week and is watching her diet. Patient has not received a Tdap vaccine.  Past medical history,surgical history, family history and social history were all reviewed and documented in the EPIC chart.  Gynecologic History Patient's last menstrual period was 03/12/2013. Contraception: post menopausal status Last Pap: 2011. Results were: normal Last mammogram: 2005. Results were: normal  Obstetric History OB History   Grav Para Term Preterm Abortions TAB SAB Ect Mult Living   2 2 2       2      # Outc Date GA Lbr Len/2nd Wgt Sex Del Anes PTL Lv   1 TRM     F CS  No Yes   2 TRM     F CS  No Yes       ROS: A ROS was performed and pertinent positives and negatives are included in the history.  GENERAL: No fevers or chills. HEENT: No change in vision, no earache, sore throat or sinus congestion. NECK: No pain or stiffness. CARDIOVASCULAR: No chest pain or pressure. No palpitations. PULMONARY: No shortness of breath, cough or wheeze. GASTROINTESTINAL: No abdominal pain,  nausea, vomiting or diarrhea, melena or bright red blood per rectum. GENITOURINARY: No urinary frequency, urgency, hesitancy or dysuria. MUSCULOSKELETAL: No joint or muscle pain, no back pain, no recent trauma. DERMATOLOGIC: No rash, no itching, no lesions. ENDOCRINE: No polyuria, polydipsia, no heat or cold intolerance. No recent change in weight. HEMATOLOGICAL: No anemia or easy bruising or bleeding. NEUROLOGIC: No headache, seizures, numbness, tingling or weakness. PSYCHIATRIC: No depression, no loss of interest in normal activity or change in sleep pattern.     Exam: chaperone present  BP 128/78  Ht 5' 5.5" (1.664 m)  Wt 173 lb (78.472 kg)  BMI 28.34 kg/m2  LMP 03/12/2013  Body mass index is 28.34 kg/(m^2).  General appearance : Well developed well nourished female. No acute distress HEENT: Neck supple, trachea midline, no carotid bruits, no thyroidmegaly Lungs: Clear to auscultation, no rhonchi or wheezes, or rib retractions  Heart: Regular rate and rhythm, no murmurs or gallops Breast:Examined in sitting and supine position were symmetrical in appearance, no palpable masses or tenderness,  no skin retraction, no nipple inversion, no nipple discharge, no skin discoloration, no axillary or supraclavicular lymphadenopathy Abdomen: no palpable masses or tenderness, no rebound or guarding Extremities: no edema or skin discoloration or tenderness  Pelvic:  Bartholin, Urethra, Skene Glands: Within normal limits  Vagina: No gross lesions or discharge  Cervix: No gross lesions or discharge  Uterus  Somewhat limited due to vaginismus Adnexa  Limited due to vaginismus Anus and perineum  normal   Rectovaginal  normal sphincter tone without palpated masses or tenderness             Hemoccult not done     Assessment/Plan:  47 y.o. female for annual exam complaining of weight gain and abdominal bloating. Patient's primary physician recently placed her on Belviq twice a day. The  following labs were ordered today: CBC, screen cholesterol, hemoglobin A1c, thyroid panel, comprehensive metabolic panel and urinalysis as well as Pap smear. Patient will schedule a mammogram which is long-overdue. Due to limitations on pelvic exam today and symptoms of bloating an ultrasound will be ordered here in the office in the next couple of weeks. She received a Tdap vaccine and literature and information was provided.  New CDC guidelines is recommending patients be tested once in her lifetime for hepatitis C antibody who were born between [birth year] through 1967. This was discussed with the patient today and has agreed to be tested today.  Patient was reminded of the importance of calcium and vitamin D intake and uterine or exercise.    Ok Edwards MD, 1:04 PM 03/29/2013

## 2013-03-30 LAB — CBC WITH DIFFERENTIAL/PLATELET
Basophils Absolute: 0 10*3/uL (ref 0.0–0.1)
Basophils Relative: 1 % (ref 0–1)
HCT: 41.2 % (ref 36.0–46.0)
MCHC: 33.7 g/dL (ref 30.0–36.0)
Monocytes Absolute: 0.4 10*3/uL (ref 0.1–1.0)
Neutro Abs: 3.4 10*3/uL (ref 1.7–7.7)
Neutrophils Relative %: 58 % (ref 43–77)
RDW: 13.7 % (ref 11.5–15.5)

## 2013-04-02 ENCOUNTER — Encounter: Payer: Self-pay | Admitting: Gynecology

## 2013-04-13 ENCOUNTER — Ambulatory Visit (INDEPENDENT_AMBULATORY_CARE_PROVIDER_SITE_OTHER): Payer: 59 | Admitting: Gynecology

## 2013-04-13 ENCOUNTER — Ambulatory Visit (INDEPENDENT_AMBULATORY_CARE_PROVIDER_SITE_OTHER): Payer: 59

## 2013-04-13 DIAGNOSIS — D259 Leiomyoma of uterus, unspecified: Secondary | ICD-10-CM

## 2013-04-13 DIAGNOSIS — N951 Menopausal and female climacteric states: Secondary | ICD-10-CM

## 2013-04-13 DIAGNOSIS — R143 Flatulence: Secondary | ICD-10-CM

## 2013-04-13 DIAGNOSIS — R140 Abdominal distension (gaseous): Secondary | ICD-10-CM

## 2013-04-13 NOTE — Progress Notes (Signed)
Patient presented to the office today for ultrasound due to the fact that the time of her annual exam in July 24 her pelvic exam was somewhat difficult due to her vaginismus. Patient is menopausal with minimal vasomotor symptoms and is not on any hormone replacement therapy. She is currently on Belviq which she takes twice a day for weight reduction prescribed by another provider. She has had no side effects and stated that in the past month she has lost 3 pounds. She does exercise on a regular basis. Her recent CBC, comprehensive metabolic panel, TSH, and lipid profile were all normal.  Ultrasound today: Uterus measured 10.7 x 6.9 x 5.6 cm with endometrial stripe of 7.2 mm. Patient had 5 small fibroids the largest one measuring 17 x 12 mm.  Assessment /plan: Patient perimenopausal minimal vasomotor symptoms. Normal ultrasound normal recent labs. Normal mammogram recently. Patient scheduled to return back in 1 year or when necessary.

## 2014-04-03 ENCOUNTER — Encounter: Payer: Self-pay | Admitting: Gynecology

## 2014-04-03 ENCOUNTER — Ambulatory Visit (INDEPENDENT_AMBULATORY_CARE_PROVIDER_SITE_OTHER): Payer: BC Managed Care – PPO | Admitting: Gynecology

## 2014-04-03 VITALS — BP 124/76 | Ht 65.5 in | Wt 185.0 lb

## 2014-04-03 DIAGNOSIS — R635 Abnormal weight gain: Secondary | ICD-10-CM

## 2014-04-03 DIAGNOSIS — Z780 Asymptomatic menopausal state: Secondary | ICD-10-CM

## 2014-04-03 DIAGNOSIS — Z01419 Encounter for gynecological examination (general) (routine) without abnormal findings: Secondary | ICD-10-CM

## 2014-04-03 DIAGNOSIS — Z833 Family history of diabetes mellitus: Secondary | ICD-10-CM

## 2014-04-03 DIAGNOSIS — N951 Menopausal and female climacteric states: Secondary | ICD-10-CM

## 2014-04-03 NOTE — Progress Notes (Signed)
Alexandra Coffey June 29, 1966 [id number]   History:    48 y.o.  for annual gyn exam who is asymptomatic today. Patient is menopausal and had visited a facility in [facility] by the name of Alexandra Coffey. Patient brought blood work before and after treatment consisting of normal thyroid panel, elevated FSH and decreased estradiol and decreased testosterone level and was started on a treatment of "subcutaneous estrogen/testosterone pellet". Her followup one month later [facility] had dropped to normal range and serum testosterone level was elevated at 290 and estradiol level had risen to 51.9 as a result of the treatment. She is also taking progesterone orally 200 mg daily she states that she feels well as having no menstrual cycles. Review her records indicated that she was weighing 173 pounds last year and is up to 185 pounds now.  Patient with no previous history of abnormal Pap smear. She has not had a baseline colonoscopy as of yet. She is due for her mammogram  Past medical history,surgical history, family history and social history were all reviewed and documented in the EPIC chart.  Gynecologic History Patient's last menstrual period was 03/11/2014. Contraception: post menopausal status Last Pap: 2014. Results were: normal Last mammogram: 2014. Results were: normal  Obstetric History OB History  Gravida Para Term Preterm AB SAB TAB Ectopic Multiple Living  2 2 2       2     # Outcome Date GA Lbr Len/2nd Weight Sex Delivery Anes PTL Lv  2 TRM     F CS  N Y  1 TRM     F CS  N Y       ROS: A ROS was performed and pertinent positives and negatives are included in the history.  GENERAL: No fevers or chills. HEENT: No change in vision, no earache, sore throat or sinus congestion. NECK: No pain or stiffness. CARDIOVASCULAR: No chest pain or pressure. No palpitations. PULMONARY: No shortness of breath, cough or wheeze. GASTROINTESTINAL: No abdominal pain,  nausea, vomiting or diarrhea, melena or bright red blood per rectum. GENITOURINARY: No urinary frequency, urgency, hesitancy or dysuria. MUSCULOSKELETAL: No joint or muscle pain, no back pain, no recent trauma. DERMATOLOGIC: No rash, no itching, no lesions. ENDOCRINE: No polyuria, polydipsia, no heat or cold intolerance. No recent change in weight. HEMATOLOGICAL: No anemia or easy bruising or bleeding. NEUROLOGIC: No headache, seizures, numbness, tingling or weakness. PSYCHIATRIC: No depression, no loss of interest in normal activity or change in sleep pattern.     Exam: chaperone present  BP 124/76  Ht 5' 5.5" (1.664 m)  Wt 185 lb (83.915 kg)  BMI 30.31 kg/m2  LMP 03/11/2014  Body mass index is 30.31 kg/(m^2).  General appearance : Well developed well nourished female. No acute distress HEENT: Neck supple, trachea midline, no carotid bruits, no thyroidmegaly Lungs: Clear to auscultation, no rhonchi or wheezes, or rib retractions  Heart: Regular rate and rhythm, no murmurs or gallops Breast:Examined in sitting and supine position were symmetrical in appearance, no palpable masses or tenderness,  no skin retraction, no nipple inversion, no nipple discharge, no skin discoloration, no axillary or supraclavicular lymphadenopathy Abdomen: no palpable masses or tenderness, no rebound or guarding Extremities: no edema or skin discoloration or tenderness  Pelvic:  Bartholin, Urethra, Skene Glands: Within normal limits             Vagina: No gross lesions or discharge  Cervix: No gross lesions or discharge  Uterus  anteverted, normal size, shape and consistency, non-tender and mobile  Adnexa  Without masses or tenderness  Anus and perineum  normal   Rectovaginal  normal sphincter tone without palpated masses or tenderness             Hemoccult colonoscopy this year     Assessment/Plan:  48 y.o. female for annual exam who is under the list take medical treatment for menopause. We had a  lengthy discussion of the concerns that I have in relation  to estrogen, testosterone and progesterone daily and risk of breast cancer.I have recommended that she take the progesterone 12 days of the month to decrease her risk/expoasure. She needs to schedule her mammogram and colonoscopy. She will return later in the week for fasting labs.  Note: This dictation was prepared with  Dragon/digital dictation along withSmart phrase technology. Any transcriptional errors that result from this process are unintentional.   Terrance Mass MD, 11:00 AM 04/03/2014

## 2014-04-03 NOTE — Patient Instructions (Signed)
Colonoscopy  A colonoscopy is an exam to look at the entire large intestine (colon). This exam can help find problems such as tumors, polyps, inflammation, and areas of bleeding. The exam takes about 1 hour.   LET YOUR HEALTH CARE PROVIDER KNOW ABOUT:   · Any allergies you have.  · All medicines you are taking, including vitamins, herbs, eye drops, creams, and over-the-counter medicines.  · Previous problems you or members of your family have had with the use of anesthetics.  · Any blood disorders you have.  · Previous surgeries you have had.  · Medical conditions you have.  RISKS AND COMPLICATIONS   Generally, this is a safe procedure. However, as with any procedure, complications can occur. Possible complications include:  · Bleeding.  · Tearing or rupture of the colon wall.  · Reaction to medicines given during the exam.  · Infection (rare).  BEFORE THE PROCEDURE   · Ask your health care provider about changing or stopping your regular medicines.  · You may be prescribed an oral bowel prep. This involves drinking a large amount of medicated liquid, starting the day before your procedure. The liquid will cause you to have multiple loose stools until your stool is almost clear or light green. This cleans out your colon in preparation for the procedure.  · Do not eat or drink anything else once you have started the bowel prep, unless your health care provider tells you it is safe to do so.  · Arrange for someone to drive you home after the procedure.  PROCEDURE   · You will be given medicine to help you relax (sedative).  · You will lie on your side with your knees bent.  · A long, flexible tube with a light and camera on the end (colonoscope) will be inserted through the rectum and into the colon. The camera sends video back to a computer screen as it moves through the colon. The colonoscope also releases carbon dioxide gas to inflate the colon. This helps your health care provider see the area better.  · During  the exam, your health care provider may take a small tissue sample (biopsy) to be examined under a microscope if any abnormalities are found.  · The exam is finished when the entire colon has been viewed.  AFTER THE PROCEDURE   · Do not drive for 24 hours after the exam.  · You may have a small amount of blood in your stool.  · You may pass moderate amounts of gas and have mild abdominal cramping or bloating. This is caused by the gas used to inflate your colon during the exam.  · Ask when your test results will be ready and how you will get your results. Make sure you get your test results.  Document Released: 08/20/2000 Document Revised: 06/13/2013 Document Reviewed: 04/30/2013  [employer]® Patient Information ©2015 [employer]. This information is not intended to replace advice given to you by your health care provider. Make sure you discuss any questions you have with your health care provider.

## 2014-04-08 ENCOUNTER — Other Ambulatory Visit: Payer: BC Managed Care – PPO

## 2014-04-08 DIAGNOSIS — Z833 Family history of diabetes mellitus: Secondary | ICD-10-CM

## 2014-04-08 DIAGNOSIS — R635 Abnormal weight gain: Secondary | ICD-10-CM

## 2014-04-08 DIAGNOSIS — Z01419 Encounter for gynecological examination (general) (routine) without abnormal findings: Secondary | ICD-10-CM

## 2014-04-08 LAB — CBC WITH DIFFERENTIAL/PLATELET
Basophils Absolute: 0.1 10*3/uL (ref 0.0–0.1)
Basophils Relative: 1 % (ref 0–1)
EOS PCT: 2 % (ref 0–5)
Eosinophils Absolute: 0.1 10*3/uL (ref 0.0–0.7)
HCT: 41.6 % (ref 36.0–46.0)
Hemoglobin: 14.1 g/dL (ref 12.0–15.0)
LYMPHS ABS: 2 10*3/uL (ref 0.7–4.0)
Lymphocytes Relative: 31 % (ref 12–46)
MCH: 29.6 pg (ref 26.0–34.0)
MCHC: 33.9 g/dL (ref 30.0–36.0)
MCV: 87.2 fL (ref 78.0–100.0)
MONO ABS: 0.6 10*3/uL (ref 0.1–1.0)
Monocytes Relative: 9 % (ref 3–12)
Neutro Abs: 3.6 10*3/uL (ref 1.7–7.7)
Neutrophils Relative %: 57 % (ref 43–77)
PLATELETS: 256 10*3/uL (ref 150–400)
RBC: 4.77 MIL/uL (ref 3.87–5.11)
RDW: 14 % (ref 11.5–15.5)
WBC: 6.3 10*3/uL (ref 4.0–10.5)

## 2014-04-08 LAB — COMPREHENSIVE METABOLIC PANEL
ALT: 18 U/L (ref 0–35)
AST: 16 U/L (ref 0–37)
Albumin: 4.1 g/dL (ref 3.5–5.2)
Alkaline Phosphatase: 50 U/L (ref 39–117)
BILIRUBIN TOTAL: 0.3 mg/dL (ref 0.2–1.2)
BUN: 11 mg/dL (ref 6–23)
CO2: 24 meq/L (ref 19–32)
CREATININE: 0.75 mg/dL (ref 0.50–1.10)
Calcium: 9 mg/dL (ref 8.4–10.5)
Chloride: 108 mEq/L (ref 96–112)
Glucose, Bld: 92 mg/dL (ref 70–99)
Potassium: 5.2 mEq/L (ref 3.5–5.3)
Sodium: 141 mEq/L (ref 135–145)
Total Protein: 6.4 g/dL (ref 6.0–8.3)

## 2014-04-08 LAB — LIPID PANEL
Cholesterol: 163 mg/dL (ref 0–200)
HDL: 37 mg/dL — AB (ref >39)
LDL Cholesterol: 104 mg/dL — ABNORMAL HIGH (ref 0–99)
TRIGLYCERIDES: 112 mg/dL (ref <150)
Total CHOL/HDL Ratio: 4.4 Ratio
VLDL: 22 mg/dL (ref 0–40)

## 2014-04-15 ENCOUNTER — Encounter: Payer: Self-pay | Admitting: Gynecology

## 2014-07-08 ENCOUNTER — Encounter: Payer: Self-pay | Admitting: Gynecology

## 2014-10-30 ENCOUNTER — Encounter: Payer: Self-pay | Admitting: Family Medicine

## 2015-02-26 ENCOUNTER — Encounter: Payer: Self-pay | Admitting: Gastroenterology

## 2015-04-18 ENCOUNTER — Encounter: Payer: Self-pay | Admitting: Gynecology

## 2015-04-29 ENCOUNTER — Encounter: Payer: Self-pay | Admitting: Gastroenterology

## 2015-05-05 ENCOUNTER — Encounter: Payer: Self-pay | Admitting: Gynecology

## 2015-05-05 ENCOUNTER — Ambulatory Visit (INDEPENDENT_AMBULATORY_CARE_PROVIDER_SITE_OTHER): Payer: BLUE CROSS/BLUE SHIELD | Admitting: Gynecology

## 2015-05-05 VITALS — BP 130/86 | Ht 65.0 in | Wt 185.0 lb

## 2015-05-05 DIAGNOSIS — K219 Gastro-esophageal reflux disease without esophagitis: Secondary | ICD-10-CM

## 2015-05-05 DIAGNOSIS — Z01419 Encounter for gynecological examination (general) (routine) without abnormal findings: Secondary | ICD-10-CM

## 2015-05-05 DIAGNOSIS — N898 Other specified noninflammatory disorders of vagina: Secondary | ICD-10-CM | POA: Diagnosis not present

## 2015-05-05 DIAGNOSIS — Z79890 Hormone replacement therapy (postmenopausal): Secondary | ICD-10-CM

## 2015-05-05 DIAGNOSIS — Z780 Asymptomatic menopausal state: Secondary | ICD-10-CM

## 2015-05-05 LAB — WET PREP FOR TRICH, YEAST, CLUE: Trich, Wet Prep: NONE SEEN

## 2015-05-05 MED ORDER — OMEPRAZOLE MAGNESIUM 20 MG PO TBEC: 20.0000 mg | DELAYED_RELEASE_TABLET | Freq: Every day | ORAL | Status: DC

## 2015-05-05 MED ORDER — TINIDAZOLE 500 MG PO TABS: ORAL_TABLET | ORAL | Status: DC

## 2015-05-05 MED ORDER — FLUCONAZOLE 150 MG PO TABS: 150.0000 mg | ORAL_TABLET | Freq: Once | ORAL | Status: DC

## 2015-05-05 NOTE — Patient Instructions (Addendum)
Gastroesophageal Reflux Disease, Adult Gastroesophageal reflux disease (GERD) happens when acid from your stomach goes into your food pipe (esophagus). The acid can cause a burning feeling in your chest. Over time, the acid can make small holes (ulcers) in your food pipe.  HOME CARE  Ask your doctor for advice about:  Losing weight.  Quitting smoking.  Alcohol use.  Avoid foods and drinks that make your problems worse. You may want to avoid:  Caffeine and alcohol.  Chocolate.  Mints.  Garlic and onions.  Spicy foods.  Citrus fruits, such as oranges, lemons, or limes.  Foods that contain tomato, such as sauce, chili, salsa, and pizza.  Fried and fatty foods.  Avoid lying down for 3 hours before you go to bed or before you take a nap.  Eat small meals often, instead of large meals.  Wear loose-fitting clothing. Do not wear anything tight around your waist.  Raise (elevate) the head of your bed 6 to 8 inches with wood blocks. Using extra pillows does not help.  Only take medicines as told by your doctor.  Do not take aspirin or ibuprofen. GET HELP RIGHT AWAY IF:   You have pain in your arms, neck, jaw, teeth, or back.  Your pain gets worse or changes.  You feel sick to your stomach (nauseous), throw up (vomit), or sweat (diaphoresis).  You feel short of breath, or you pass out (faint).  Your throw up is green, yellow, black, or looks like coffee grounds or blood.  Your poop (stool) is red, bloody, or black. MAKE SURE YOU:   Understand these instructions.  Will watch your condition.  Will get help right away if you are not doing well or get worse. Document Released: 02/09/2008 Document Revised: 11/15/2011 Document Reviewed: 03/12/2011 [facility] Patient Information 2015 [place], [place]. This information is not intended to replace advice given to you by your health care provider. Make sure you discuss any questions you have with your health care  provider. Esophagogastroduodenoscopy Esophagogastroduodenoscopy (EGD) is a procedure to examine the lining of the esophagus, stomach, and first part of the small intestine (duodenum). A long, flexible, lighted tube with a camera attached (endoscope) is inserted down the throat to view these organs. This procedure is done to detect problems or abnormalities, such as inflammation, bleeding, ulcers, or growths, in order to treat them. The procedure lasts about 5-20 minutes. It is usually an outpatient procedure, but it may need to be performed in emergency cases in the hospital. LET YOUR CAREGIVER KNOW ABOUT:  Allergies to food or medicine. All medicines you are taking, including vitamins, herbs, eyedrops, and over-the-counter medicines and creams. Use of steroids (by mouth or creams). Previous problems you or members of your family have had with the use of anesthetics. Any blood disorders you have. Previous surgeries you have had. Other health problems you have. Possibility of pregnancy, if this applies. RISKS AND COMPLICATIONS  Generally, EGD is a safe procedure. However, as with any procedure, complications can occur. Possible complications include: Infection. Bleeding. Tearing (perforation) of the esophagus, stomach, or duodenum. Difficulty breathing or not being able to breath. Excessive sweating. Spasms of the larynx. Slowed heartbeat. Low blood pressure. BEFORE THE PROCEDURE Do not eat or drink anything for 6-8 hours before the procedure or as directed by your caregiver. Ask your caregiver about changing or stopping your regular medicines. If you wear dentures, be prepared to remove them before the procedure. Arrange for someone to drive you home after the procedure. PROCEDURE  A vein will be accessed to give medicines and fluids. A medicine to relax you (sedative) and a pain reliever will be given through that access into the vein. A numbing medicine (local anesthetic) may be  sprayed on your throat for comfort and to stop you from gagging or coughing. A mouth guard may be placed in your mouth to protect your teeth and to keep you from biting on the endoscope. You will be asked to lie on your left side. The endoscope is inserted down your throat and into the esophagus, stomach, and duodenum. Air is put through the endoscope to allow your caregiver to view the lining of your esophagus clearly. The esophagus, stomach, and duodenum is then examined. During the exam, your caregiver may: Remove tissue to be examined under a microscope (biopsy) for inflammation, infection, or other medical problems. Remove growths. Remove objects (foreign bodies) that are stuck. Treat any bleeding with medicines or other devices that stop tissues from bleeding (hot cautery, clipping devices). Widen (dilate) or stretch narrowed areas of the esophagus and stomach. The endoscope will then be withdrawn. AFTER THE PROCEDURE You will be taken to a recovery area to be monitored. You will be able to go home once you are stable and alert. Do not eat or drink anything until the local anesthetic and numbing medicines have worn off. You may choke. It is normal to feel bloated, have pain with swallowing, or have a sore throat for a short time. This will wear off. Your caregiver should be able to discuss his or her findings with you. It will take longer to discuss the test results if any biopsies were taken. Document Released: 12/24/2004 Document Revised: 01/07/2014 Document Reviewed: 07/26/2012 [facility] Patient Information 2015 [place], [place]. This information is not intended to replace advice given to you by your health care provider. Make sure you discuss any questions you have with your health care provider. Yeast Monilial Vaginitis Vaginitis in a soreness, swelling and redness (inflammation) of the vagina and vulva. Monilial vaginitis is not a sexually transmitted infection. CAUSES  Yeast  vaginitis is caused by yeast (candida) that is normally found in your vagina. With a yeast infection, the candida has overgrown in number to a point that upsets the chemical balance. SYMPTOMS  White, thick vaginal discharge. Swelling, itching, redness and irritation of the vagina and possibly the lips of the vagina (vulva). Burning or painful urination. Painful intercourse. DIAGNOSIS  Things that may contribute to monilial vaginitis are: Postmenopausal and virginal states. Pregnancy. Infections. Being tired, sick or stressed, especially if you had monilial vaginitis in the past. Diabetes. Good control will help lower the chance. Birth control pills. Tight fitting garments. Using bubble bath, feminine sprays, douches or deodorant tampons. Taking certain medications that kill germs (antibiotics). Sporadic recurrence can occur if you become ill. TREATMENT  Your caregiver will give you medication. There are several kinds of anti monilial vaginal creams and suppositories specific for monilial vaginitis. For recurrent yeast infections, use a suppository or cream in the vagina 2 times a week, or as directed. Anti-monilial or steroid cream for the itching or irritation of the vulva may also be used. Get your caregiver's permission. Painting the vagina with methylene blue solution may help if the monilial cream does not work. Eating yogurt may help prevent monilial vaginitis. HOME CARE INSTRUCTIONS  Finish all medication as prescribed. Do not have sex until treatment is completed or after your caregiver tells you it is okay. Take warm sitz baths. Do not  douche. Do not use tampons, especially scented ones. Wear cotton underwear. Avoid tight pants and panty hose. Tell your sexual partner that you have a yeast infection. They should go to their caregiver if they have symptoms such as mild rash or itching. Your sexual partner should be treated as well if your infection is difficult to  eliminate. Practice safer sex. Use condoms. Some vaginal medications cause latex condoms to fail. Vaginal medications that harm condoms are: Cleocin cream. Butoconazole (Femstat). Terconazole (Terazol) vaginal suppository. Miconazole (Monistat) (may be purchased over the counter). SEEK MEDICAL CARE IF:  You have a temperature by mouth above 102 F (38.9 C). The infection is getting worse after 2 days of treatment. The infection is not getting better after 3 days of treatment. You develop blisters in or around your vagina. You develop vaginal bleeding, and it is not your menstrual period. You have pain when you urinate. You develop intestinal problems. You have pain with sexual intercourse. Document Released: 06/02/2005 Document Revised: 11/15/2011 Document Reviewed: 02/14/2009 [facility] Patient Information 2015 [place], [place]. This information is not intended to replace advice given to you by your health care provider. Make sure you discuss any questions you have with your health care provider. Bacterial Vaginosis Bacterial vaginosis is a vaginal infection that occurs when the normal balance of bacteria in the vagina is disrupted. It results from an overgrowth of certain bacteria. This is the most common vaginal infection in women of childbearing age. Treatment is important to prevent complications, especially in pregnant women, as it can cause a premature delivery. CAUSES  Bacterial vaginosis is caused by an increase in harmful bacteria that are normally present in smaller amounts in the vagina. Several different kinds of bacteria can cause bacterial vaginosis. However, the reason that the condition develops is not fully understood. RISK FACTORS Certain activities or behaviors can put you at an increased risk of developing bacterial vaginosis, including: Having a new sex partner or multiple sex partners. Douching. Using an intrauterine device (IUD) for contraception. Women do not get  bacterial vaginosis from toilet seats, bedding, swimming pools, or contact with objects around them. SIGNS AND SYMPTOMS  Some women with bacterial vaginosis have no signs or symptoms. Common symptoms include: Grey vaginal discharge. A fishlike odor with discharge, especially after sexual intercourse. Itching or burning of the vagina and vulva. Burning or pain with urination. DIAGNOSIS  Your health care provider will take a medical history and examine the vagina for signs of bacterial vaginosis. A sample of vaginal fluid may be taken. Your health care provider will look at this sample under a microscope to check for bacteria and abnormal cells. A vaginal pH test may also be done.  TREATMENT  Bacterial vaginosis may be treated with antibiotic medicines. These may be given in the form of a pill or a vaginal cream. A second round of antibiotics may be prescribed if the condition comes back after treatment.  HOME CARE INSTRUCTIONS  Only take over-the-counter or prescription medicines as directed by your health care provider. If antibiotic medicine was prescribed, take it as directed. Make sure you finish it even if you start to feel better. Do not have sex until treatment is completed. Tell all sexual partners that you have a vaginal infection. They should see their health care provider and be treated if they have problems, such as a mild rash or itching. Practice safe sex by using condoms and only having one sex partner. SEEK MEDICAL CARE IF:  Your symptoms are not  improving after 3 days of treatment. You have increased discharge or pain. You have a fever. MAKE SURE YOU:  Understand these instructions. Will watch your condition. Will get help right away if you are not doing well or get worse. FOR MORE INFORMATION  Centers for Disease Control and Prevention, Division of STD Prevention: AppraiserFraud[employer]fi American Sexual Health Association (ASHA): www.ashastd.org  Document Released: 08/23/2005  Document Revised: 06/13/2013 Document Reviewed: 04/04/2013 [facility] Patient Information 2015 [place], [place]. This information is not intended to replace advice given to you by your health care provider. Make sure you discuss any questions you have with your health care provider.

## 2015-05-05 NOTE — Progress Notes (Signed)
Alexandra Coffey 09/24/1965 [id number]   History:    49 y.o.  for annual gyn exam with complaint of heartburn no matter what food or drink she has. Review of her record indicated that her father had esophageal cancer but on questioning he was a smoker. Patient is scheduled for colonoscopy this November with her PCP has set up with a gastroenterologist. Patient has been followed by [place] in [facility] for her menopausal symptoms. She is no longer on the subcutaneous estrogen/testosterone pellets that only on testosterone pellets patient feels well. She was also taking Prometrium 200 mg every day of the month. She is complaining of weight gain. She is currently weighing 185 pounds with a BMI of 30.79 kg/m. Her last menstrual period was one year ago. Patient with no past history of any abnormal Pap smear  Past medical history,surgical history, family history and social history were all reviewed and documented in the EPIC chart.  Gynecologic History No LMP recorded. Patient is not currently having periods (Reason: Other). Contraception: post menopausal status Last Pap: 2014. Results were: normal Last mammogram: 2016. Results were: normal  Obstetric History OB History  Gravida Para Term Preterm AB SAB TAB Ectopic Multiple Living  2 2 2       2     # Outcome Date GA Lbr Len/2nd Weight Sex Delivery Anes PTL Lv  2 Term     F CS-Unspec  N Y  1 Term     F CS-Unspec  N Y       ROS: A ROS was performed and pertinent positives and negatives are included in the history.  GENERAL: No fevers or chills. HEENT: No change in vision, no earache, sore throat or sinus congestion. NECK: No pain or stiffness. CARDIOVASCULAR: No chest pain or pressure. No palpitations. PULMONARY: No shortness of breath, cough or wheeze. GASTROINTESTINAL: No abdominal pain, nausea, vomiting or diarrhea, melena or bright red blood per rectum. GENITOURINARY: No urinary frequency,  urgency, hesitancy or dysuria. MUSCULOSKELETAL: No joint or muscle pain, no back pain, no recent trauma. DERMATOLOGIC: No rash, no itching, no lesions. ENDOCRINE: No polyuria, polydipsia, no heat or cold intolerance. No recent change in weight. HEMATOLOGICAL: No anemia or easy bruising or bleeding. NEUROLOGIC: No headache, seizures, numbness, tingling or weakness. PSYCHIATRIC: No depression, no loss of interest in normal activity or change in sleep pattern.     Exam: chaperone present  BP 130/86 mmHg  Ht 5\' 5"  (1.651 m)  Wt 185 lb (83.915 kg)  BMI 30.79 kg/m2  Body mass index is 30.79 kg/(m^2).  General appearance : Well developed well nourished female. No acute distress HEENT: Eyes: no retinal hemorrhage or exudates,  Neck supple, trachea midline, no carotid bruits, no thyroidmegaly Lungs: Clear to auscultation, no rhonchi or wheezes, or rib retractions  Heart: Regular rate and rhythm, no murmurs or gallops Breast:Examined in sitting and supine position were symmetrical in appearance, no palpable masses or tenderness,  no skin retraction, no nipple inversion, no nipple discharge, no skin discoloration, no axillary or supraclavicular lymphadenopathy Abdomen: no palpable masses or tenderness, no rebound or guarding Extremities: no edema or skin discoloration or tenderness  Pelvic:  Bartholin, Urethra, Skene Glands: Within normal limits             Vagina: Creamy discharge  Cervix: No gross lesions or discharge  Uterus  anteverted, normal size, shape and consistency, non-tender and mobile  Adnexa  Without masses or tenderness  Anus  and perineum  normal   Rectovaginal  normal sphincter tone without palpated masses or tenderness             Hemoccult colonoscopy November 2016   Wet prep few yeast, clue cells, too numerous to count WBC, moderate bacteria  Assessment/Plan:  49 y.o. female for annual exam with clinical evidence of yeast vaginitis and bacterial vaginosis. She will be  prescribed Diflucan 150 mg today. I'm going to prescribe also Tindamax 500 mg tablet. Patient to take 4 tablets tomorrow and then 4 tablets the following day. Patient will cut down on her progesterone from daily to 12 days of the month to help with her weight. We discussed importance of weightbearing exercises as well as calcium vitamin D for osteoporosis prevention and next year we will obtain a vitamin D level. Pap smear not done today in accordance to the new guidelines. For her heartburn I'm going to prescribe Prilosec 20 mg to take by mouth in the morning. I've recommended she contact her primary care physician to coordinate with her gastroenterologist that at the time of her colonoscopy to consider having an EGD because of her father's history of esophageal cancer and also to rule out erosive esophagitis or peptic ulcer disease.   Terrance Mass MD, 4:57 PM 05/05/2015

## 2015-06-27 ENCOUNTER — Ambulatory Visit (AMBULATORY_SURGERY_CENTER): Payer: Self-pay

## 2015-06-27 VITALS — Ht 65.0 in | Wt 188.4 lb

## 2015-06-27 DIAGNOSIS — Z1211 Encounter for screening for malignant neoplasm of colon: Secondary | ICD-10-CM

## 2015-06-27 MED ORDER — SUPREP BOWEL PREP KIT 17.5-3.13-1.6 GM/177ML PO SOLN: 1.0000 | Freq: Once | ORAL | Status: DC

## 2015-06-27 NOTE — Progress Notes (Signed)
No allergies to eggs or soy No home oxygen No diet/weight loss meds No past problems with anesthesia PONV general  Has internet; refused emmi

## 2015-07-08 ENCOUNTER — Encounter: Payer: Self-pay | Admitting: Gastroenterology

## 2015-07-08 ENCOUNTER — Ambulatory Visit (INDEPENDENT_AMBULATORY_CARE_PROVIDER_SITE_OTHER): Payer: BLUE CROSS/BLUE SHIELD | Admitting: Gastroenterology

## 2015-07-08 VITALS — BP 110/70 | HR 72 | Ht 65.0 in | Wt 185.0 lb

## 2015-07-08 DIAGNOSIS — K219 Gastro-esophageal reflux disease without esophagitis: Secondary | ICD-10-CM

## 2015-07-08 NOTE — Progress Notes (Signed)
I agree with the above note, plan 

## 2015-07-08 NOTE — Progress Notes (Signed)
     07/08/2015 Alexandra Coffey [id number] September 16, 1965   HISTORY OF PRESENT ILLNESS:  This is a 49 year old female who is here today at the request of her GYN, Dr. Toney Rakes, to discuss EGD for evaluation regarding reflux symptoms.  She started with new onset reflux in February.  Never had reflux symptoms in the past, but when this began it was affecting her daily with everything that she ate.  Was recently started on omeprazole daily (unsure of dose) and since beginning that medication she's had significant improvement/resolution of her symptoms.  She is scheduled for a colonoscopy later this week and would like to add an EGD to the procedure at the request of Dr. Toney Rakes.   Past Medical History  Diagnosis Date  . Broken bones     ankles  . 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 Shingles   . 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  . GERD (gastroesophageal reflux disease)    Past Surgical History  Procedure Laterality Date  . Laparoscopic cholecystectomy  1995  . Augmentation mammaplasty  2003    SALINE  . Abdominoplasty    . Orif ankle fracture  2005    reports that she quit smoking about 8 years ago. She has never used smokeless tobacco. She reports that she does not drink alcohol or use illicit drugs. family history includes Cancer in her father; Dementia in her mother; Diabetes in her mother; Hyperlipidemia in her mother; Osteoporosis in her mother. There is no history of Colon cancer. Allergies  Allergen Reactions  . Codeine     Makes me feel dizzy, drunk, disoriented  . Sulfa Antibiotics Swelling    tongue      Outpatient Encounter Prescriptions as of 07/08/2015  Medication Sig  . ALPRAZolam (XANAX) 0.5 MG tablet Take 0.5 mg by mouth at bedtime as needed.    Marland Kitchen omeprazole (PRILOSEC OTC) 20 MG tablet Take 1 tablet (20 mg total) by mouth daily.  Manus Gunning BOWEL PREP SOLN Take 1 kit by mouth once.  . [DISCONTINUED] phentermine 37.5 MG capsule Take 1  capsule (37.5 mg total) by mouth every morning.   No facility-administered encounter medications on file as of 07/08/2015.     REVIEW OF SYSTEMS  : All other systems reviewed and negative except where noted in the History of Present Illness.   PHYSICAL EXAM: BP 110/70 mmHg  Pulse 72  Ht _0  (1.651 m)  Wt 185 lb (83.915 kg)  BMI 30.79 kg/m2 General: Well developed white female in no acute distress Head: Normocephalic and atraumatic Eyes:  Sclerae anicteric, conjunctiva pink. Ears: Normal auditory acuity. Lungs: Clear throughout to auscultation Heart: Regular rate and rhythm Abdomen: Soft, non-distended.  Normal bowel sounds.  Non-tender. Rectal:  Will be done at the time of colonoscopy. Musculoskeletal: Symmetrical with no gross deformities  Skin: No lesions on visible extremities Extremities: No edema  Neurological: Alert oriented x 4, grossly non-focal Psychological:  Alert and cooperative. Normal mood and affect  ASSESSMENT AND PLAN: -GERD:  Now well-controlled and much improved on omeprazole daily.  Patient's GYN requesting EGD in addition to her already scheduled colonoscopy later this week.  The risks, benefits, and alternatives to EGD were discussed with the patient and she consents to proceed.  Continue daily PPI.  CC:  Smothers, Andree Elk, NP

## 2015-07-08 NOTE — Patient Instructions (Signed)
Proceed with colon/EGD with Dr Ardis Hughs as scheduled. Remember to have ONLY clear liquids the day before!!

## 2015-07-11 ENCOUNTER — Encounter: Payer: Self-pay | Admitting: Gastroenterology

## 2015-07-11 ENCOUNTER — Ambulatory Visit (AMBULATORY_SURGERY_CENTER): Payer: BLUE CROSS/BLUE SHIELD | Admitting: Gastroenterology

## 2015-07-11 VITALS — BP 110/75 | HR 58 | Temp 98.8°F | Resp 26 | Ht 65.0 in | Wt 185.0 lb

## 2015-07-11 DIAGNOSIS — K219 Gastro-esophageal reflux disease without esophagitis: Secondary | ICD-10-CM | POA: Diagnosis not present

## 2015-07-11 DIAGNOSIS — Z1211 Encounter for screening for malignant neoplasm of colon: Secondary | ICD-10-CM | POA: Diagnosis present

## 2015-07-11 MED ORDER — SODIUM CHLORIDE 0.9 % IV SOLN: 500.0000 mL | INTRAVENOUS | Status: DC

## 2015-07-11 MED ORDER — OMEPRAZOLE MAGNESIUM 20 MG PO TBEC: 20.0000 mg | DELAYED_RELEASE_TABLET | Freq: Every day | ORAL | Status: DC

## 2015-07-11 NOTE — Op Note (Signed)
[place]  [address] [place], [id number]   COLONOSCOPY PROCEDURE REPORT  PATIENT: Alexandra Coffey, Alexandra Coffey  MR#: [id number] BIRTHDATE: [birth year] , 49  yrs. old GENDER: female ENDOSCOPIST: Milus Banister, MD REFERRED GM:WNUUVOZ Smothers, MD PROCEDURE DATE:  07/11/2015 PROCEDURE:   Colonoscopy, screening First Screening Colonoscopy - Avg.  risk and is 50 yrs.  old or older Yes.  Prior Negative Screening - Now for repeat screening. N/A  History of Adenoma - Now for follow-up colonoscopy & has been > or = to 3 yrs.  N/A  Recommend repeat exam, <10 yrs? No ASA CLASS:   Class II INDICATIONS:Screening for colonic neoplasia and Colorectal Neoplasm Risk Assessment for this procedure is average risk. MEDICATIONS: Monitored anesthesia care and Propofol 200 mg IV  DESCRIPTION OF PROCEDURE:   After the risks benefits and alternatives of the procedure were thoroughly explained, informed consent was obtained.  The digital rectal exam revealed no abnormalities of the rectum.   The LB [id number]  endoscope was introduced through the anus and advanced to the cecum, which was identified by both the appendix and ileocecal valve. No adverse events experienced.   The quality of the prep was good.  The instrument was then slowly withdrawn as the colon was fully examined. Estimated blood loss is zero unless otherwise noted in this procedure report.   COLON FINDINGS: A normal appearing cecum, ileocecal valve, and appendiceal orifice were identified.  The ascending, transverse, descending, sigmoid colon, and rectum appeared unremarkable. Retroflexed views revealed no abnormalities. The time to cecum = 4.1 Withdrawal time = 8.9   The scope was withdrawn and the procedure completed. COMPLICATIONS: There were no immediate complications.  ENDOSCOPIC IMPRESSION: Normal colonoscopy No polyps or cancers  RECOMMENDATIONS: You should continue to follow colorectal cancer screening  guidelines for "routine risk" patients with a repeat colonoscopy in 10 years.   eSigned:  Milus Banister, MD 07/11/2015 9:21 AM

## 2015-07-11 NOTE — Addendum Note (Signed)
Addended by: Fletcher Anon on: 07/11/2015 10:37 AM   Modules accepted: Orders

## 2015-07-11 NOTE — Op Note (Signed)
[place]  [address] [place], [id number]   ENDOSCOPY PROCEDURE REPORT  PATIENT: Alexandra Coffey, Alexandra Coffey  MR#: [id number] BIRTHDATE: February 14, 1966 , 49  yrs. old GENDER: female ENDOSCOPIST: Milus Banister, MD PROCEDURE DATE:  07/11/2015 PROCEDURE:  EGD, diagnostic ASA CLASS:     Class II INDICATIONS:  recent GERD, improved with PPI once daily. MEDICATIONS: Monitored anesthesia care and Propofol 100 mg IV TOPICAL ANESTHETIC: none  DESCRIPTION OF PROCEDURE: After the risks benefits and alternatives of the procedure were thoroughly explained, informed consent was obtained.  The LB [id number] endoscope was introduced through the mouth and advanced to the second portion of the duodenum , Without limitations.  The instrument was slowly withdrawn as the mucosa was fully examined.  There was a small (3cm) hiatal hernia with very minor appearing Cameron's type erosions.  The examination was otherwise normal. Retroflexed views revealed no abnormalities.     The scope was then withdrawn from the patient and the procedure completed. COMPLICATIONS: There were no immediate complications.  ENDOSCOPIC IMPRESSION: There was a small (3cm) hiatal hernia with very minor appearing Cameron's type erosions.  The examination was otherwise normal  RECOMMENDATIONS: Please continue once daily omeprazole for another 4 weeks, then try tapering off slowly.  Call to report on your progress in 2 months.   eSigned:  Milus Banister, MD 07/11/2015 9:27 AM   cc: Augusto Gamble, MD

## 2015-07-11 NOTE — Patient Instructions (Signed)
Findings;  Normal Colonoscopy, Small hiatal hernia Recommendations:  Repeat colonoscopy in 10 Years, Continue omeprazole once daily for 4 weeks then try tapering off.  Call office in two months to report on progress.  YOU HAD AN ENDOSCOPIC PROCEDURE TODAY AT [facility]:   Refer to the procedure report that was given to you for any specific questions about what was found during the examination.  If the procedure report does not answer your questions, please call your gastroenterologist to clarify.  If you requested that your care partner not be given the details of your procedure findings, then the procedure report has been included in a sealed envelope for you to review at your convenience later.  YOU SHOULD EXPECT: Some feelings of bloating in the abdomen. Passage of more gas than usual.  Walking can help get rid of the air that was put into your GI tract during the procedure and reduce the bloating. If you had a lower endoscopy (such as a colonoscopy or flexible sigmoidoscopy) you may notice spotting of blood in your stool or on the toilet paper. If you underwent a bowel prep for your procedure, you may not have a normal bowel movement for a few days.  Please Note:  You might notice some irritation and congestion in your nose or some drainage.  This is from the oxygen used during your procedure.  There is no need for concern and it should clear up in a day or so.  SYMPTOMS TO REPORT IMMEDIATELY:   Following lower endoscopy (colonoscopy or flexible sigmoidoscopy):  Excessive amounts of blood in the stool  Significant tenderness or worsening of abdominal pains  Swelling of the abdomen that is new, acute  Fever of 100F or higher   Following upper endoscopy (EGD)  Vomiting of blood or coffee ground material  New chest pain or pain under the shoulder blades  Painful or persistently difficult swallowing  New shortness of breath  Fever of 100F or higher  Black, tarry-looking  stools  For urgent or emergent issues, a gastroenterologist can be reached at any hour by calling [phone].   DIET: Your first meal following the procedure should be a small meal and then it is ok to progress to your normal diet. Heavy or fried foods are harder to digest and may make you feel nauseous or bloated.  Likewise, meals heavy in dairy and vegetables can increase bloating.  Drink plenty of fluids but you should avoid alcoholic beverages for 24 hours.  ACTIVITY:  You should plan to take it easy for the rest of today and you should NOT DRIVE or use heavy machinery until tomorrow (because of the sedation medicines used during the test).    FOLLOW UP: Our staff will call the number listed on your records the next business day following your procedure to check on you and address any questions or concerns that you may have regarding the information given to you following your procedure. If we do not reach you, we will leave a message.  However, if you are feeling well and you are not experiencing any problems, there is no need to return our call.  We will assume that you have returned to your regular daily activities without incident.  If any biopsies were taken you will be contacted by phone or by letter within the next 1-3 weeks.  Please call us at [phone] if you have not heard about the biopsies in 3 weeks.    SIGNATURES/CONFIDENTIALITY: You  and/or your care partner have signed paperwork which will be entered into your electronic medical record.  These signatures attest to the fact that that the information above on your After Visit Summary has been reviewed and is understood.  Full responsibility of the confidentiality of this discharge information lies with you and/or your care-partner.  Please follow all discharge instructions given to you by the recovery room nurse. If you have any questions or problems after discharge please call one of the numbers listed above. You will  receive a phone call in the am to see how you are doing and answer any questions you may have. Thank you for choosing [facility] for your health care needs.

## 2015-07-11 NOTE — Progress Notes (Signed)
To recovery, report to Michaeljoseph Revolorio, RN, VSS. 

## 2015-07-14 ENCOUNTER — Telehealth: Payer: Self-pay

## 2015-07-14 NOTE — Telephone Encounter (Signed)
  Follow up Call-  Call back number 07/11/2015  Post procedure Call Back phone  # [phone]  Permission to leave phone message Yes     Patient questions:  Do you have a fever, pain , or abdominal swelling? No. Pain Score  0 *  Have you tolerated food without any problems? Yes.    Have you been able to return to your normal activities? Yes.    Do you have any questions about your discharge instructions: Diet   No. Medications  No. Follow up visit  No.  Do you have questions or concerns about your Care? No.  Actions: * If pain score is 4 or above: No action needed, pain <4.  Per the pt, "I am doing well". maw

## 2015-07-30 ENCOUNTER — Other Ambulatory Visit: Payer: Self-pay | Admitting: Gynecology

## 2015-08-29 ENCOUNTER — Other Ambulatory Visit: Payer: Self-pay | Admitting: Gynecology

## 2015-08-29 NOTE — Telephone Encounter (Signed)
Last filled on 05/05/15 with 2 refills

## 2015-12-27 ENCOUNTER — Other Ambulatory Visit: Payer: Self-pay | Admitting: Gynecology

## 2015-12-29 NOTE — Telephone Encounter (Signed)
Last refill on 05/05/15 with 2 refills

## 2016-04-19 ENCOUNTER — Encounter: Payer: Self-pay | Admitting: Gynecology

## 2016-05-05 ENCOUNTER — Ambulatory Visit (INDEPENDENT_AMBULATORY_CARE_PROVIDER_SITE_OTHER): Payer: BLUE CROSS/BLUE SHIELD | Admitting: Gynecology

## 2016-05-05 ENCOUNTER — Encounter: Payer: Self-pay | Admitting: Gynecology

## 2016-05-05 VITALS — BP 126/84 | Ht 65.5 in | Wt 175.0 lb

## 2016-05-05 DIAGNOSIS — Z780 Asymptomatic menopausal state: Secondary | ICD-10-CM | POA: Diagnosis not present

## 2016-05-05 DIAGNOSIS — Z01419 Encounter for gynecological examination (general) (routine) without abnormal findings: Secondary | ICD-10-CM

## 2016-05-05 NOTE — Patient Instructions (Signed)

## 2016-05-05 NOTE — Progress Notes (Signed)
Alexandra Coffey 1965/12/31 EI:[id number]   History:    50 y.o.  for annual gyn exam with no complaints today. Patient had a colonoscopy last year last year which was reported to be normal and she is on a 10 year recall. Patient is currently being followed by [facility] in [facility] for her menopausal symptoms whereby she is receiving testosterone palate's subcutaneous every 3 months. Patient with no past history of any abnormal Pap smears. Patient with no vasomotor symptoms and no vaginal bleeding.   Past medical history,surgical history, family history and social history were all reviewed and documented in the EPIC chart.  Gynecologic History No LMP recorded. Patient is not currently having periods (Reason: Other). Contraception: post menopausal status Last Pap: 2014. Results were: normal Last mammogram: 2017. Results were: We do not have the report the patient stated she received a letter and she was informed it was normal.  Obstetric History OB History  Gravida Para Term Preterm AB Living  2 2 2     2   SAB TAB Ectopic Multiple Live Births          2    # Outcome Date GA Lbr Len/2nd Weight Sex Delivery Anes PTL Lv  2 Term     F CS-Unspec  N LIV  1 Term     F CS-Unspec  N LIV       ROS: A ROS was performed and pertinent positives and negatives are included in the history.  GENERAL: No fevers or chills. HEENT: No change in vision, no earache, sore throat or sinus congestion. NECK: No pain or stiffness. CARDIOVASCULAR: No chest pain or pressure. No palpitations. PULMONARY: No shortness of breath, cough or wheeze. GASTROINTESTINAL: No abdominal pain, nausea, vomiting or diarrhea, melena or bright red blood per rectum. GENITOURINARY: No urinary frequency, urgency, hesitancy or dysuria. MUSCULOSKELETAL: No joint or muscle pain, no back pain, no recent trauma. DERMATOLOGIC: No rash, no itching, no lesions. ENDOCRINE: No polyuria, polydipsia, no  heat or cold intolerance. No recent change in weight. HEMATOLOGICAL: No anemia or easy bruising or bleeding. NEUROLOGIC: No headache, seizures, numbness, tingling or weakness. PSYCHIATRIC: No depression, no loss of interest in normal activity or change in sleep pattern.     Exam: chaperone present  BP 126/84   Ht 5' 5.5" (1.664 m)   Wt 175 lb (79.4 kg)   BMI 28.68 kg/m   Body mass index is 28.68 kg/m.  General appearance : Well developed well nourished female. No acute distress HEENT: Eyes: no retinal hemorrhage or exudates,  Neck supple, trachea midline, no carotid bruits, no thyroidmegaly Lungs: Clear to auscultation, no rhonchi or wheezes, or rib retractions  Heart: Regular rate and rhythm, no murmurs or gallops Breast:Examined in sitting and supine position were symmetrical in appearance, no palpable masses or tenderness,  no skin retraction, no nipple inversion, no nipple discharge, no skin discoloration, no axillary or supraclavicular lymphadenopathy Abdomen: no palpable masses or tenderness, no rebound or guarding Extremities: no edema or skin discoloration or tenderness  Pelvic:  Bartholin, Urethra, Skene Glands: Within normal limits             Vagina: No gross lesions or discharge  Cervix: No gross lesions or discharge  Uterus  anteverted, normal size, shape and consistency, non-tender and mobile  Adnexa  Without masses or tenderness  Anus and perineum  normal   Rectovaginal  normal sphincter tone without palpated masses or tenderness  Hemoccult colonoscopy less than 12 months ago     Assessment/Plan:  50 y.o. female for annual exam doing well has lost 10 pounds from last year. She is on testosterone pellets every 3 months that she is receiving from a holistic medicine provider in [facility]. She has been advised of the risks benefits and pros and cons. We discussed importance of calcium vitamin D and weightbearing exercises for osteoporosis  prevention. She will schedule a bone density study here in the office the next few weeks and she'll return to the office in a fasting state for the following screening blood work: Comprehensive metabolic panel, fasting lipid profile, TSH, CBC, and urinalysis. Pap smear was done today.   Terrance Mass MD, 11:48 AM 05/05/2016

## 2016-05-05 NOTE — Addendum Note (Signed)
Addended by: Thurnell Garbe A on: 05/05/2016 02:33 PM   Modules accepted: Orders

## 2016-05-06 ENCOUNTER — Other Ambulatory Visit: Payer: BLUE CROSS/BLUE SHIELD

## 2016-05-06 ENCOUNTER — Other Ambulatory Visit: Payer: Self-pay | Admitting: *Deleted

## 2016-05-06 DIAGNOSIS — R3129 Other microscopic hematuria: Secondary | ICD-10-CM

## 2016-05-06 LAB — CBC WITH DIFFERENTIAL/PLATELET
BASOS ABS: 0 {cells}/uL (ref 0–200)
Basophils Relative: 0 %
EOS ABS: 65 {cells}/uL (ref 15–500)
Eosinophils Relative: 1 %
HEMATOCRIT: 47.9 % — AB (ref 35.0–45.0)
Hemoglobin: 16.3 g/dL — ABNORMAL HIGH (ref 11.7–15.5)
LYMPHS PCT: 27 %
Lymphs Abs: 1755 cells/uL (ref 850–3900)
MCH: 31.7 pg (ref 27.0–33.0)
MCHC: 34 g/dL (ref 32.0–36.0)
MCV: 93.2 fL (ref 80.0–100.0)
MONO ABS: 650 {cells}/uL (ref 200–950)
MONOS PCT: 10 %
MPV: 11.2 fL (ref 7.5–12.5)
Neutro Abs: 4030 cells/uL (ref 1500–7800)
Neutrophils Relative %: 62 %
PLATELETS: 181 10*3/uL (ref 140–400)
RBC: 5.14 MIL/uL — ABNORMAL HIGH (ref 3.80–5.10)
RDW: 14.1 % (ref 11.0–15.0)
WBC: 6.5 10*3/uL (ref 3.8–10.8)

## 2016-05-06 LAB — COMPREHENSIVE METABOLIC PANEL
ALBUMIN: 4.2 g/dL (ref 3.6–5.1)
ALT: 14 U/L (ref 6–29)
AST: 18 U/L (ref 10–35)
Alkaline Phosphatase: 53 U/L (ref 33–130)
BUN: 16 mg/dL (ref 7–25)
CHLORIDE: 105 mmol/L (ref 98–110)
CO2: 26 mmol/L (ref 20–31)
Calcium: 9.5 mg/dL (ref 8.6–10.4)
Creat: 0.84 mg/dL (ref 0.50–1.05)
Glucose, Bld: 81 mg/dL (ref 65–99)
POTASSIUM: 4.6 mmol/L (ref 3.5–5.3)
Sodium: 142 mmol/L (ref 135–146)
TOTAL PROTEIN: 6.7 g/dL (ref 6.1–8.1)
Total Bilirubin: 0.5 mg/dL (ref 0.2–1.2)

## 2016-05-06 LAB — URINALYSIS W MICROSCOPIC + REFLEX CULTURE
BACTERIA UA: NONE SEEN [HPF]
Bilirubin Urine: NEGATIVE
CASTS: NONE SEEN [LPF]
CRYSTALS: NONE SEEN [HPF]
Glucose, UA: NEGATIVE
KETONES UR: NEGATIVE
Leukocytes, UA: NEGATIVE
Nitrite: NEGATIVE
Protein, ur: NEGATIVE
RBC / HPF: NONE SEEN RBC/HPF (ref <=2)
SQUAMOUS EPITHELIAL / LPF: NONE SEEN [HPF] (ref <=5)
Specific Gravity, Urine: 1.004 (ref 1.001–1.035)
WBC, UA: NONE SEEN WBC/HPF (ref <=5)
Yeast: NONE SEEN [HPF]
pH: 7 (ref 5.0–8.0)

## 2016-05-06 LAB — LIPID PANEL
CHOL/HDL RATIO: 4.8 ratio (ref <=5.0)
CHOLESTEROL: 195 mg/dL (ref 125–200)
HDL: 41 mg/dL — ABNORMAL LOW (ref >=46)
LDL Cholesterol: 135 mg/dL — ABNORMAL HIGH (ref <130)
TRIGLYCERIDES: 94 mg/dL (ref <150)
VLDL: 19 mg/dL (ref <30)

## 2016-05-06 LAB — PAP IG W/ RFLX HPV ASCU

## 2016-05-06 LAB — TSH: TSH: 1.64 mIU/L

## 2016-05-12 ENCOUNTER — Other Ambulatory Visit: Payer: Self-pay | Admitting: Gynecology

## 2016-05-12 ENCOUNTER — Other Ambulatory Visit: Payer: BLUE CROSS/BLUE SHIELD

## 2016-05-12 DIAGNOSIS — R3129 Other microscopic hematuria: Secondary | ICD-10-CM

## 2016-05-13 LAB — URINALYSIS W MICROSCOPIC + REFLEX CULTURE
BILIRUBIN URINE: NEGATIVE
CRYSTALS: NONE SEEN [HPF]
Casts: NONE SEEN [LPF]
Glucose, UA: NEGATIVE
HGB URINE DIPSTICK: NEGATIVE
KETONES UR: NEGATIVE
Leukocytes, UA: NEGATIVE
Nitrite: NEGATIVE
PROTEIN: NEGATIVE
RBC / HPF: NONE SEEN RBC/HPF (ref <=2)
SQUAMOUS EPITHELIAL / LPF: NONE SEEN [HPF] (ref <=5)
Specific Gravity, Urine: 1.007 (ref 1.001–1.035)
WBC UA: NONE SEEN WBC/HPF (ref <=5)
Yeast: NONE SEEN [HPF]
pH: 6 (ref 5.0–8.0)

## 2016-05-15 LAB — URINE CULTURE

## 2016-05-17 ENCOUNTER — Other Ambulatory Visit: Payer: Self-pay | Admitting: Gynecology

## 2016-05-17 MED ORDER — CIPROFLOXACIN HCL 250 MG PO TABS: 250.0000 mg | ORAL_TABLET | Freq: Two times a day (BID) | ORAL | 0 refills | Status: DC

## 2016-05-25 ENCOUNTER — Telehealth: Payer: Self-pay

## 2016-05-25 ENCOUNTER — Other Ambulatory Visit: Payer: Self-pay | Admitting: Gynecology

## 2016-05-25 ENCOUNTER — Other Ambulatory Visit: Payer: BLUE CROSS/BLUE SHIELD

## 2016-05-25 DIAGNOSIS — Z780 Asymptomatic menopausal state: Secondary | ICD-10-CM

## 2016-05-25 DIAGNOSIS — R8271 Bacteriuria: Secondary | ICD-10-CM

## 2016-05-25 NOTE — Telephone Encounter (Signed)
Okay 

## 2016-05-25 NOTE — Telephone Encounter (Signed)
Patient was treated on 05/17/16 with Cipro x 3 days for UTI.  She wants to come and have urine rechecked to be certain infection is gone. Ok to order?

## 2016-05-25 NOTE — Progress Notes (Unsigned)
Left message per DPR access that ok for u/a. Order is placed and she just needs to call back and schedule lab appt.

## 2016-05-26 LAB — URINALYSIS W MICROSCOPIC + REFLEX CULTURE
Bacteria, UA: NONE SEEN [HPF]
Bilirubin Urine: NEGATIVE
CASTS: NONE SEEN [LPF]
Crystals: NONE SEEN [HPF]
GLUCOSE, UA: NEGATIVE
HGB URINE DIPSTICK: NEGATIVE
Ketones, ur: NEGATIVE
LEUKOCYTES UA: NEGATIVE
NITRITE: NEGATIVE
PH: 6 (ref 5.0–8.0)
Protein, ur: NEGATIVE
RBC / HPF: NONE SEEN RBC/HPF (ref <=2)
SPECIFIC GRAVITY, URINE: 1.013 (ref 1.001–1.035)
SQUAMOUS EPITHELIAL / LPF: NONE SEEN [HPF] (ref <=5)
WBC, UA: NONE SEEN WBC/HPF (ref <=5)
YEAST: NONE SEEN [HPF]

## 2016-06-29 ENCOUNTER — Other Ambulatory Visit: Payer: Self-pay

## 2016-06-30 MED ORDER — OMEPRAZOLE MAGNESIUM 20 MG PO TBEC: 20.0000 mg | DELAYED_RELEASE_TABLET | Freq: Every day | ORAL | 2 refills | Status: DC

## 2016-08-03 ENCOUNTER — Telehealth: Payer: Self-pay | Admitting: *Deleted

## 2016-08-03 NOTE — Telephone Encounter (Signed)
The vaginal discharge/itching can be any type of organism so this is why she needs to be examen besides the u/a

## 2016-08-03 NOTE — Telephone Encounter (Signed)
Pt informed with the below note. 

## 2016-08-03 NOTE — Telephone Encounter (Signed)
Pt called c/o burning with urination and slight vaginal itch, was treated with Cipro in Sept. Asked if she could come and leave U/A only without office visit.  Please advise

## 2016-08-04 ENCOUNTER — Ambulatory Visit (INDEPENDENT_AMBULATORY_CARE_PROVIDER_SITE_OTHER): Payer: BLUE CROSS/BLUE SHIELD | Admitting: Gynecology

## 2016-08-04 ENCOUNTER — Encounter: Payer: Self-pay | Admitting: Gynecology

## 2016-08-04 VITALS — BP 126/80 | Ht 65.0 in | Wt 175.0 lb

## 2016-08-04 DIAGNOSIS — N898 Other specified noninflammatory disorders of vagina: Secondary | ICD-10-CM | POA: Diagnosis not present

## 2016-08-04 DIAGNOSIS — B3731 Acute candidiasis of vulva and vagina: Secondary | ICD-10-CM

## 2016-08-04 DIAGNOSIS — N950 Postmenopausal bleeding: Secondary | ICD-10-CM | POA: Diagnosis not present

## 2016-08-04 DIAGNOSIS — B373 Candidiasis of vulva and vagina: Secondary | ICD-10-CM | POA: Diagnosis not present

## 2016-08-04 DIAGNOSIS — B9689 Other specified bacterial agents as the cause of diseases classified elsewhere: Secondary | ICD-10-CM

## 2016-08-04 DIAGNOSIS — N760 Acute vaginitis: Secondary | ICD-10-CM

## 2016-08-04 DIAGNOSIS — R300 Dysuria: Secondary | ICD-10-CM

## 2016-08-04 LAB — WET PREP FOR TRICH, YEAST, CLUE: Trich, Wet Prep: NONE SEEN

## 2016-08-04 LAB — URINALYSIS W MICROSCOPIC + REFLEX CULTURE
BACTERIA UA: NONE SEEN [HPF]
BILIRUBIN URINE: NEGATIVE
CASTS: NONE SEEN [LPF]
CRYSTALS: NONE SEEN [HPF]
GLUCOSE, UA: NEGATIVE
KETONES UR: NEGATIVE
Leukocytes, UA: NEGATIVE
Nitrite: NEGATIVE
PROTEIN: NEGATIVE
Specific Gravity, Urine: <1.005 (ref 1.001–1.035)
WBC UA: NONE SEEN WBC/HPF (ref <=5)
Yeast: NONE SEEN [HPF]
pH: 5 (ref 5.0–8.0)

## 2016-08-04 MED ORDER — FLUCONAZOLE 150 MG PO TABS: ORAL_TABLET | ORAL | 1 refills | Status: DC

## 2016-08-04 MED ORDER — TINIDAZOLE 500 MG PO TABS
ORAL_TABLET | ORAL | 0 refills | Status: DC
Start: 2016-08-04 — End: 2017-04-04

## 2016-08-04 NOTE — Patient Instructions (Signed)
Bacterial Vaginosis Bacterial vaginosis is a vaginal infection that occurs when the normal balance of bacteria in the vagina is disrupted. It results from an overgrowth of certain bacteria. This is the most common vaginal infection among women ages 82-44. Because bacterial vaginosis increases your risk for STIs (sexually transmitted infections), getting treated can help reduce your risk for chlamydia, gonorrhea, herpes, and HIV (human immunodeficiency virus). Treatment is also important for preventing complications in pregnant women, because this condition can cause an early (premature) delivery. What are the causes? This condition is caused by an increase in harmful bacteria that are normally present in small amounts in the vagina. However, the reason that the condition develops is not fully understood. What increases the risk? The following factors may make you more likely to develop this condition:  Having a new sexual partner or multiple sexual partners.  Having unprotected sex.  Douching.  Having an intrauterine device (IUD).  Smoking.  Drug and alcohol abuse.  Taking certain antibiotic medicines.  Being pregnant. You cannot get bacterial vaginosis from toilet seats, bedding, swimming pools, or contact with objects around you. What are the signs or symptoms? Symptoms of this condition include:  Grey or white vaginal discharge. The discharge can also be watery or foamy.  A fish-like odor with discharge, especially after sexual intercourse or during menstruation.  Itching in and around the vagina.  Burning or pain with urination. Some women with bacterial vaginosis have no signs or symptoms. How is this diagnosed? This condition is diagnosed based on:  Your medical history.  A physical exam of the vagina.  Testing a sample of vaginal fluid under a microscope to look for a large amount of bad bacteria or abnormal cells. Your health care provider may use a cotton swab or a  small wooden spatula to collect the sample. How is this treated? This condition is treated with antibiotics. These may be given as a pill, a vaginal cream, or a medicine that is put into the vagina (suppository). If the condition comes back after treatment, a second round of antibiotics may be needed. Follow these instructions at home: Medicines  Take over-the-counter and prescription medicines only as told by your health care provider.  Take or use your antibiotic as told by your health care provider. Do not stop taking or using the antibiotic even if you start to feel better. General instructions  If you have a female sexual partner, tell her that you have a vaginal infection. She should see her health care provider and be treated if she has symptoms. If you have a female sexual partner, he does not need treatment.  During treatment:  Avoid sexual activity until you finish treatment.  Do not douche.  Avoid alcohol as directed by your health care provider.  Avoid breastfeeding as directed by your health care provider.  Drink enough water and fluids to keep your urine clear or pale yellow.  Keep the area around your vagina and rectum clean.  Wash the area daily with warm water.  Wipe yourself from front to back after using the toilet.  Keep all follow-up visits as told by your health care provider. This is important. How is this prevented?  Do not douche.  Wash the outside of your vagina with warm water only.  Use protection when having sex. This includes latex condoms and dental dams.  Limit how many sexual partners you have. To help prevent bacterial vaginosis, it is best to have sex with just one partner (monogamous).  partner (monogamous).  Make sure you and your sexual partner are tested for STIs.  Wear cotton or cotton-lined underwear.  Avoid wearing tight pants and pantyhose, especially during summer.  Limit the amount of alcohol that you drink.  Do not use any products that contain  nicotine or tobacco, such as cigarettes and e-cigarettes. If you need help quitting, ask your health care provider.  Do not use illegal drugs. Where to find more information:  Centers for Disease Control and Prevention: www.cdc.gov/std  American Sexual Health Association (ASHA): www.ashastd.org  U.S. Department of Health and Human Services, Office on Women's Health: www.womenshealth.gov/ or https://www.womenshealth.gov/a-z-topics/bacterial-vaginosis Contact a health care provider if:  Your symptoms do not improve, even after treatment.  You have more discharge or pain when urinating.  You have a fever.  You have pain in your abdomen.  You have pain during sex.  You have vaginal bleeding between periods. Summary  Bacterial vaginosis is a vaginal infection that occurs when the normal balance of bacteria in the vagina is disrupted.  Because bacterial vaginosis increases your risk for STIs (sexually transmitted infections), getting treated can help reduce your risk for chlamydia, gonorrhea, herpes, and HIV (human immunodeficiency virus). Treatment is also important for preventing complications in pregnant women, because the condition can cause an early (premature) delivery.  This condition is treated with antibiotic medicines. These may be given as a pill, a vaginal cream, or a medicine that is put into the vagina (suppository). This information is not intended to replace advice given to you by your health care provider. Make sure you discuss any questions you have with your health care provider. Document Released: 08/23/2005 Document Revised: 05/08/2016 Document Reviewed: 05/08/2016 Elsevier Interactive Patient Education  2017 Elsevier Inc. Vaginal Yeast infection, Adult Vaginal yeast infection is a condition that causes soreness, swelling, and redness (inflammation) of the vagina. It also causes vaginal discharge. This is a common condition. Some women get this infection  frequently. What are the causes? This condition is caused by a change in the normal balance of the yeast (candida) and bacteria that live in the vagina. This change causes an overgrowth of yeast, which causes the inflammation. What increases the risk? This condition is more likely to develop in:  Women who take antibiotic medicines.  Women who have diabetes.  Women who take birth control pills.  Women who are pregnant.  Women who douche often.  Women who have a weak defense (immune) system.  Women who have been taking steroid medicines for a long time.  Women who frequently wear tight clothing. What are the signs or symptoms? Symptoms of this condition include:  White, thick vaginal discharge.  Swelling, itching, redness, and irritation of the vagina. The lips of the vagina (vulva) may be affected as well.  Pain or a burning feeling while urinating.  Pain during sex. How is this diagnosed? This condition is diagnosed with a medical history and physical exam. This will include a pelvic exam. Your health care provider will examine a sample of your vaginal discharge under a microscope. Your health care provider may send this sample for testing to confirm the diagnosis. How is this treated? This condition is treated with medicine. Medicines may be over-the-counter or prescription. You may be told to use one or more of the following:  Medicine that is taken orally.  Medicine that is applied as a cream.  Medicine that is inserted directly into the vagina (suppository). Follow these instructions at home:  Take or apply over-the-counter   and prescription medicines only as told by your health care provider.  Do not have sex until your health care provider has approved. Tell your sex partner that you have a yeast infection. That person should go to his or her health care provider if he or she develops symptoms.  Do not wear tight clothes, such as pantyhose or tight pants.  Avoid  using tampons until your health care provider approves.  Eat more yogurt. This may help to keep your yeast infection from returning.  Try taking a sitz bath to help with discomfort. This is a warm water bath that is taken while you are sitting down. The water should only come up to your hips and should cover your buttocks. Do this 3-4 times per day or as told by your health care provider.  Do not douche.  Wear breathable, cotton underwear.  If you have diabetes, keep your blood sugar levels under control. Contact a health care provider if:  You have a fever.  Your symptoms go away and then return.  Your symptoms do not get better with treatment.  Your symptoms get worse.  You have new symptoms.  You develop blisters in or around your vagina.  You have blood coming from your vagina and it is not your menstrual period.  You develop pain in your abdomen. This information is not intended to replace advice given to you by your health care provider. Make sure you discuss any questions you have with your health care provider. Document Released: 06/02/2005 Document Revised: 02/04/2016 Document Reviewed: 02/24/2015 Elsevier Interactive Patient Education  2017 Elsevier Inc.  

## 2016-08-04 NOTE — Progress Notes (Signed)
   Patient is a 50 year old that presented to the office today complaining of a vaginal discharge with odor for the past few days. Patient is not sexually active. Patient was also having vulvar pruritus. Patient was seen the office in August of this year for her annual exam. Patient is currently being followed by [facility] in [facility] for her menopausal symptoms whereby she is receiving testosterone palate's subcutaneous every 3 months. Patient has reported no vaginal bleeding.  Exam: Abdomen: Soft nontender no rebound or guarding Pelvic: Bartholin urethra Skene was normal limits Vagina yellow/mustard-colored discharge was noted wet prep obtained Cervix: As above Bimanual exam not done Rectal exam: Not done  Urinalysis sent her to 3 RBC otherwise negative. Urine culture submitted  Wet prep moderate yeast, moderate clue cells, too numerous to count white blood cell, too numerous to count bacteria  Assessment/plan: Patient with bacterial vaginosis and yeast vaginitis will be treated in the following fashion: Diflucan 150 mg today. Also she'll be prescribed Tindamax 500 mg tablets. She will take 4 tablets today and repeat in 24 hours. And the following day she will take the second Diflucan 150 mg. She will return back to the office in 7-10 days for sonohysterogram and possible endometrial biopsy since I am concerned with the color of the vagina discharge whether could be blood coming from the uterus as well.

## 2016-08-05 LAB — URINE CULTURE

## 2016-08-06 ENCOUNTER — Other Ambulatory Visit: Payer: Self-pay | Admitting: Gynecology

## 2016-08-06 ENCOUNTER — Telehealth: Payer: Self-pay | Admitting: *Deleted

## 2016-08-06 DIAGNOSIS — N950 Postmenopausal bleeding: Secondary | ICD-10-CM

## 2016-08-06 NOTE — Telephone Encounter (Signed)
[employer] benefits per automated system stated all OV's and diagnostic procedures performed in doctors office were subject to 20% co-insurance after $1500deductible which is met. Once OPM of $4500 met then covered at 100%, met $1520.77.  Pt respon is $225.32. Pt will be informed. KW CMA

## 2016-08-11 ENCOUNTER — Ambulatory Visit (INDEPENDENT_AMBULATORY_CARE_PROVIDER_SITE_OTHER): Payer: BLUE CROSS/BLUE SHIELD

## 2016-08-11 ENCOUNTER — Ambulatory Visit (INDEPENDENT_AMBULATORY_CARE_PROVIDER_SITE_OTHER): Payer: BLUE CROSS/BLUE SHIELD | Admitting: Gynecology

## 2016-08-11 ENCOUNTER — Encounter: Payer: Self-pay | Admitting: Gynecology

## 2016-08-11 DIAGNOSIS — N760 Acute vaginitis: Secondary | ICD-10-CM | POA: Diagnosis not present

## 2016-08-11 DIAGNOSIS — N950 Postmenopausal bleeding: Secondary | ICD-10-CM

## 2016-08-11 DIAGNOSIS — B9689 Other specified bacterial agents as the cause of diseases classified elsewhere: Secondary | ICD-10-CM | POA: Diagnosis not present

## 2016-08-11 DIAGNOSIS — N898 Other specified noninflammatory disorders of vagina: Secondary | ICD-10-CM | POA: Diagnosis not present

## 2016-08-11 LAB — WET PREP FOR TRICH, YEAST, CLUE
CLUE CELLS WET PREP: NONE SEEN
TRICH WET PREP: NONE SEEN
Yeast Wet Prep HPF POC: NONE SEEN

## 2016-08-11 MED ORDER — METRONIDAZOLE 0.75 % VA GEL: VAGINAL | 1 refills | Status: DC

## 2016-08-11 MED ORDER — METRONIDAZOLE 500 MG PO TABS: ORAL_TABLET | ORAL | 1 refills | Status: DC

## 2016-08-11 NOTE — Progress Notes (Signed)
   Patient is a 50 year old that presented to the office today for a scheduled sonohysterogram due to the fact that she was seen in the office on November 29 complaining of vaginal discharge with odor for a few days after she had gotten out of the gym. She states she has not been sexually active for 2 years and was also having some vulvar pruritus.Patient is currently being followed by [place] in [facility] for her menopausal symptoms whereby she is receiving testosterone palate's subcutaneous every 3 months. Patient has reported no vaginal bleeding.  At that last office visit. The pelvic exam demonstrated a yellow/mustard colored discharge with odor and a wet prep had been done which had demonstrated the following:Wet prep moderate yeast, moderate clue cells, too numerous to count white blood cell, too numerous to count bacteria  For this reason she was treated in the following fashion: For yeast vaginitis she had been prescribed Diflucan 150 mg followed by Tindamax 500 mg 4 tablets 24 hours apart and one more Diflucan 150 mg 72 hours from the previous Diflucan.  We will going to be sonohysterogram today but she stated that she was having the same discharge back again she started to feel better when she finishes the above treatment. When she came out the gym took a shower and nose same discharge.  Wet prep today: Few white blood cells and too numerous to count bacteria  Ultrasound uterus measured 8.8 x 6.2 x 4.9 cm with endometrial stripe at 2.7 mm patient with 5 small intramural fibroids that of been stable through the years the largest one measuring 35 x 28 mm. Right and left ovary were otherwise normal.  Assessment/plan: We'll hold off any sonohysterogram at the present time because of her bacterial infection. I'm expecting to the patient that up to 30% of patients may have recurrent bacterial vaginosis so we will need to do a long-term treatment of 4  months as follows: She will be started on MetroGel vaginal cream to apply daily at bedtime for 10 days. Upon completion of this 10 day therapy she will begin taking Flagyl 500 mg one tablet twice a week for 4 months. If she has no further issues she will not need a sonohysterogram that she notices any vaginal bleeding or return with any other problems. Patient denies any history of any intestinal disorder such as ulcerated colitis or Crohn's disease and the only operations have with several years ago she had C-section.

## 2016-12-13 ENCOUNTER — Ambulatory Visit (INDEPENDENT_AMBULATORY_CARE_PROVIDER_SITE_OTHER): Payer: BLUE CROSS/BLUE SHIELD | Admitting: Gynecology

## 2016-12-13 ENCOUNTER — Encounter: Payer: Self-pay | Admitting: Gynecology

## 2016-12-13 VITALS — BP 118/76 | Ht 65.0 in | Wt 163.6 lb

## 2016-12-13 DIAGNOSIS — N898 Other specified noninflammatory disorders of vagina: Secondary | ICD-10-CM | POA: Diagnosis not present

## 2016-12-13 DIAGNOSIS — N760 Acute vaginitis: Secondary | ICD-10-CM | POA: Insufficient documentation

## 2016-12-13 LAB — WET PREP FOR TRICH, YEAST, CLUE
Clue Cells Wet Prep HPF POC: NONE SEEN
Trich, Wet Prep: NONE SEEN
Yeast Wet Prep HPF POC: NONE SEEN

## 2016-12-13 MED ORDER — METRONIDAZOLE 0.75 % VA GEL: 1.0000 | Freq: Two times a day (BID) | VAGINAL | 1 refills | Status: DC

## 2016-12-13 NOTE — Patient Instructions (Signed)
Metronidazole vaginal gel What is this medicine? METRONIDAZOLE (me troe NI da zole) VAGINAL GEL is an antiinfective. It is used to treat bacterial vaginitis. This medicine may be used for other purposes; ask your health care provider or pharmacist if you have questions. COMMON BRAND NAME(S): MetroGel, MetroGel Vaginal, MetroGel-Vaginal, NUVESSA, Vandazole What should I tell my health care provider before I take this medicine? They need to know if you have any of these conditions: -if you drink alcohol containing drinks -if you have taken disulfiram in the past two weeks -liver disease -peripheral neuropathy -seizures -an unusual or allergic reaction to metronidazole, parabens, nitroimidazoles, or other medicines, foods, dyes, or preservatives -pregnant or trying to get pregnant -breast-feeding How should I use this medicine? This medicine is only for use in the vagina. Do not take by mouth or apply to other areas of the body. Follow the directions on the prescription label. Wash hands before and after use. Screw the applicator to the tube and squeeze the tube gently to fill the applicator. Lie on your back, part and bend your knees. Insert the applicator tip high in the vagina and push the plunger to release the gel into the vagina. Gently remove the applicator. Wash the applicator well with warm water and soap. Use at regular intervals. Finish the full course prescribed by your doctor or health care professional even if you think your condition is better. Do not stop using except on the advice of your doctor or health care professional. Talk to your pediatrician regarding the use of this medicine in children. Special care may be needed. Overdosage: If you think you have taken too much of this medicine contact a poison control center or emergency room at once. NOTE: This medicine is only for you. Do not share this medicine with others. What if I miss a dose? If you miss a dose, use it as soon as  you can. If it is almost time for your next dose, use only that dose. Do not use double or extra doses. What may interact with this medicine? Do not take this medicine with any of the following medications: -alcohol or any product that contains alcohol -cisapride -dofetilide -dronedarone -pimozide -thioridazine -ziprasidone This medicine may also interact with the following medications: -cimetidine -lithium -other medicines that prolong the QT interval (cause an abnormal heart rhythm) -warfarin This list may not describe all possible interactions. Give your health care provider a list of all the medicines, herbs, non-prescription drugs, or dietary supplements you use. Also tell them if you smoke, drink alcohol, or use illegal drugs. Some items may interact with your medicine. What should I watch for while using this medicine? Tell your doctor or health care professional if your symptoms do not start to get better in 2 or 3 days. Avoid alcoholic drinks while you are taking this medicine and for three days afterwards. Alcohol may make you feel dizzy, sick, or flushed. You may get drowsy or dizzy. Do not drive, use machinery, or do anything that needs mental alertness until you know how this medicine affects you. To reduce the risk of dizzy or fainting spells, do not sit or stand up quickly, especially if you are an older patient. Your clothing may get soiled if you have a vaginal discharge. You can wear a sanitary napkin. Do not use tampons. Wear freshly washed cotton, not synthetic, panties. Do not have sex until you have finished your treatment. Having sex can make the treatment less effective. Your sexual partner  may also need treatment. What side effects may I notice from receiving this medicine? Side effects that you should report to your doctor or health care professional as soon as possible: -dizziness -frequent passing of urine -headache -loss of appetite -nausea -skin rash,  itching -stomach pain or cramps -vaginal irritation or discharge -vulvar burning or swelling Side effects that usually do not require medical attention (report to your doctor or health care professional if they continue or are bothersome): -dark urine -mild vaginal burning This list may not describe all possible side effects. Call your doctor for medical advice about side effects. You may report side effects to FDA at 1-800-FDA-1088. Where should I keep my medicine? Keep out of the reach of children. Store at room temperature between 15 and 30 degrees C (59 and 86 degrees F). Do not freeze. Throw away any unused medicine after the expiration date. NOTE: This sheet is a summary. It may not cover all possible information. If you have questions about this medicine, talk to your doctor, pharmacist, or health care provider.  2018 Elsevier/Gold Standard (2013-03-30 14:09:23) Bacterial Vaginosis Bacterial vaginosis is a vaginal infection that occurs when the normal balance of bacteria in the vagina is disrupted. It results from an overgrowth of certain bacteria. This is the most common vaginal infection among women ages 71-44. Because bacterial vaginosis increases your risk for STIs (sexually transmitted infections), getting treated can help reduce your risk for chlamydia, gonorrhea, herpes, and HIV (human immunodeficiency virus). Treatment is also important for preventing complications in pregnant women, because this condition can cause an early (premature) delivery. What are the causes? This condition is caused by an increase in harmful bacteria that are normally present in small amounts in the vagina. However, the reason that the condition develops is not fully understood. What increases the risk? The following factors may make you more likely to develop this condition:  Having a new sexual partner or multiple sexual partners.  Having unprotected sex.  Douching.  Having an intrauterine device  (IUD).  Smoking.  Drug and alcohol abuse.  Taking certain antibiotic medicines.  Being pregnant. You cannot get bacterial vaginosis from toilet seats, bedding, swimming pools, or contact with objects around you. What are the signs or symptoms? Symptoms of this condition include:  Grey or white vaginal discharge. The discharge can also be watery or foamy.  A fish-like odor with discharge, especially after sexual intercourse or during menstruation.  Itching in and around the vagina.  Burning or pain with urination. Some women with bacterial vaginosis have no signs or symptoms. How is this diagnosed? This condition is diagnosed based on:  Your medical history.  A physical exam of the vagina.  Testing a sample of vaginal fluid under a microscope to look for a large amount of bad bacteria or abnormal cells. Your health care provider may use a cotton swab or a small wooden spatula to collect the sample. How is this treated? This condition is treated with antibiotics. These may be given as a pill, a vaginal cream, or a medicine that is put into the vagina (suppository). If the condition comes back after treatment, a second round of antibiotics may be needed. Follow these instructions at home: Medicines   Take over-the-counter and prescription medicines only as told by your health care provider.  Take or use your antibiotic as told by your health care provider. Do not stop taking or using the antibiotic even if you start to feel better. General instructions   If  you have a female sexual partner, tell her that you have a vaginal infection. She should see her health care provider and be treated if she has symptoms. If you have a female sexual partner, he does not need treatment.  During treatment:  Avoid sexual activity until you finish treatment.  Do not douche.  Avoid alcohol as directed by your health care provider.  Avoid breastfeeding as directed by your health care  provider.  Drink enough water and fluids to keep your urine clear or pale yellow.  Keep the area around your vagina and rectum clean.  Wash the area daily with warm water.  Wipe yourself from front to back after using the toilet.  Keep all follow-up visits as told by your health care provider. This is important. How is this prevented?  Do not douche.  Wash the outside of your vagina with warm water only.  Use protection when having sex. This includes latex condoms and dental dams.  Limit how many sexual partners you have. To help prevent bacterial vaginosis, it is best to have sex with just one partner (monogamous).  Make sure you and your sexual partner are tested for STIs.  Wear cotton or cotton-lined underwear.  Avoid wearing tight pants and pantyhose, especially during summer.  Limit the amount of alcohol that you drink.  Do not use any products that contain nicotine or tobacco, such as cigarettes and e-cigarettes. If you need help quitting, ask your health care provider.  Do not use illegal drugs. Where to find more information:  Centers for Disease Control and Prevention: AppraiserFraud.fi  American Sexual Health Association (ASHA): www.ashastd.org  U.S. Department of Health and Financial controller, Office on Women's Health: DustingSprays.pl or SecuritiesCard.it Contact a health care provider if:  Your symptoms do not improve, even after treatment.  You have more discharge or pain when urinating.  You have a fever.  You have pain in your abdomen.  You have pain during sex.  You have vaginal bleeding between periods. Summary  Bacterial vaginosis is a vaginal infection that occurs when the normal balance of bacteria in the vagina is disrupted.  Because bacterial vaginosis increases your risk for STIs (sexually transmitted infections), getting treated can help reduce your risk for chlamydia, gonorrhea, herpes, and HIV  (human immunodeficiency virus). Treatment is also important for preventing complications in pregnant women, because the condition can cause an early (premature) delivery.  This condition is treated with antibiotic medicines. These may be given as a pill, a vaginal cream, or a medicine that is put into the vagina (suppository). This information is not intended to replace advice given to you by your health care provider. Make sure you discuss any questions you have with your health care provider. Document Released: 08/23/2005 Document Revised: 05/08/2016 Document Reviewed: 05/08/2016 Elsevier Interactive Patient Education  2017 [employer].

## 2016-12-13 NOTE — Progress Notes (Signed)
   Patient is a 51 year old that presented to the office today with concerns that she may have a recurrent BV. Patient has had this history in the past and just last week complaining a form of treatment for recurrent BV whereby she was taking Flagyl 500 mg twice a week for 4 months. She also been taking a probiotic tablet. She is not sexually active and not on any hormone replacement therapy. She denied any vaginal bleeding, fever, chills, nausea, vomiting or any GU or GI complaints.  Exam: Back: No CVA tenderness Abdomen: Soft nontender no rebound or guarding Pelvic: Bartholin urethra Skene was within normal limits Vagina: No gross lesions on inspection Cervix: No gross lesions on inspection or discharge Bimanual exam not done Rectal exam not done  Wet prep was cell seen but moderate bacteria too numerous to count white blood cell.  Assessment/plan: Possible recurrent BV patient stated that back in December which she started the form of treatment her symptoms were 10 over 10 and today there were 2/10 and she wanted to make sure was treated on time. She will be prescribed Cleocin vaginal cream to apply daily at bedtime for one week. We did do a 1 swab vaginal culture to look for specifics she's for BV and yeast. She'll be instructed to use MetroGel probiotic gel once a week as well after she completes this treatment. She's otherwise scheduled to return in July for annual exam her period.

## 2016-12-16 ENCOUNTER — Telehealth: Payer: Self-pay | Admitting: *Deleted

## 2016-12-16 NOTE — Telephone Encounter (Signed)
Pt called requesting vaginal swab results form 12/13/16, I called pt and received her voicemail I left that results are not back yet and culture is not done in our office, it went to main lab. I told pt we would call her with results.

## 2016-12-17 LAB — SURESWAB BACTERIAL VAGINOSIS/ITIS
Atopobium vaginae: NOT DETECTED Log (cells/mL)
C. GLABRATA, DNA: NOT DETECTED
C. PARAPSILOSIS, DNA: DETECTED — AB
C. albicans, DNA: NOT DETECTED
C. tropicalis, DNA: NOT DETECTED
Gardnerella vaginalis: NOT DETECTED Log (cells/mL)
LACTOBACILLUS SPECIES: NOT DETECTED Log (cells/mL)
MEGASPHAERA SPECIES: NOT DETECTED Log (cells/mL)
T. vaginalis RNA, QL TMA: NOT DETECTED

## 2016-12-20 ENCOUNTER — Other Ambulatory Visit: Payer: Self-pay | Admitting: Gynecology

## 2016-12-20 MED ORDER — FLUCONAZOLE 100 MG PO TABS: 100.0000 mg | ORAL_TABLET | ORAL | 0 refills | Status: DC

## 2017-01-19 ENCOUNTER — Encounter: Payer: Self-pay | Admitting: Gynecology

## 2017-04-04 ENCOUNTER — Ambulatory Visit (INDEPENDENT_AMBULATORY_CARE_PROVIDER_SITE_OTHER): Payer: BLUE CROSS/BLUE SHIELD | Admitting: Gynecology

## 2017-04-04 ENCOUNTER — Encounter: Payer: Self-pay | Admitting: Gynecology

## 2017-04-04 VITALS — BP 104/72 | Ht 65.75 in | Wt 166.0 lb

## 2017-04-04 DIAGNOSIS — Z01419 Encounter for gynecological examination (general) (routine) without abnormal findings: Secondary | ICD-10-CM | POA: Diagnosis not present

## 2017-04-04 DIAGNOSIS — Z79890 Hormone replacement therapy (postmenopausal): Secondary | ICD-10-CM | POA: Diagnosis not present

## 2017-04-04 DIAGNOSIS — N952 Postmenopausal atrophic vaginitis: Secondary | ICD-10-CM | POA: Diagnosis not present

## 2017-04-04 DIAGNOSIS — R300 Dysuria: Secondary | ICD-10-CM | POA: Diagnosis not present

## 2017-04-04 DIAGNOSIS — N898 Other specified noninflammatory disorders of vagina: Secondary | ICD-10-CM

## 2017-04-04 LAB — URINALYSIS W MICROSCOPIC + REFLEX CULTURE
BACTERIA UA: NONE SEEN [HPF]
Bilirubin Urine: NEGATIVE
CASTS: NONE SEEN [LPF]
Crystals: NONE SEEN [HPF]
GLUCOSE, UA: NEGATIVE
HGB URINE DIPSTICK: NEGATIVE
KETONES UR: NEGATIVE
LEUKOCYTES UA: NEGATIVE
Nitrite: NEGATIVE
PROTEIN: NEGATIVE
RBC / HPF: NONE SEEN RBC/HPF (ref <=2)
Specific Gravity, Urine: <1.005 (ref 1.001–1.035)
WBC, UA: NONE SEEN WBC/HPF (ref <=5)
YEAST: NONE SEEN [HPF]
pH: 5.5 (ref 5.0–8.0)

## 2017-04-04 MED ORDER — NITROFURANTOIN MONOHYD MACRO 100 MG PO CAPS: ORAL_CAPSULE | ORAL | 3 refills | Status: DC

## 2017-04-04 MED ORDER — ESTRADIOL 10 MCG VA TABS: 1.0000 | ORAL_TABLET | VAGINAL | 11 refills | Status: DC

## 2017-04-04 NOTE — Progress Notes (Signed)
Alexandra Coffey 1966-03-18 [id number]   History:    51 y.o.  for annual gyn exam with the only complaint is one of vaginal dryness.Patient is currently being followed by [facility] in [facility] for her menopausal symptoms whereby she is receiving testosterone palate's subcutaneous every 3 months. Patient with no past history of any abnormal Pap smears. Patient with no vasomotor symptoms and no vaginal bleeding. Patient had a normal colonoscopy in 2015. She was concerned whether she has some discomfort and irritation after intercourse especially during voiding. Her urinalysis today was essentially negative.  Past medical history,surgical history, family history and social history were all reviewed and documented in the EPIC chart.  Gynecologic History Patient's last menstrual period was 03/11/2014. Contraception: post menopausal status Last Pap: 2017. Results were: normal Last mammogram: 2017. Results were: normal  Obstetric History OB History  Gravida Para Term Preterm AB Living  2 2 2     2   SAB TAB Ectopic Multiple Live Births          2    # Outcome Date GA Lbr Len/2nd Weight Sex Delivery Anes PTL Lv  2 Term     F CS-Unspec  N LIV  1 Term     F CS-Unspec  N LIV       ROS: A ROS was performed and pertinent positives and negatives are included in the history.  GENERAL: No fevers or chills. HEENT: No change in vision, no earache, sore throat or sinus congestion. NECK: No pain or stiffness. CARDIOVASCULAR: No chest pain or pressure. No palpitations. PULMONARY: No shortness of breath, cough or wheeze. GASTROINTESTINAL: No abdominal pain, nausea, vomiting or diarrhea, melena or bright red blood per rectum. GENITOURINARY: No urinary frequency, urgency, hesitancy or dysuria. MUSCULOSKELETAL: No joint or muscle pain, no back pain, no recent trauma. DERMATOLOGIC: No rash, no itching, no lesions. ENDOCRINE: No polyuria, polydipsia, no heat or cold  intolerance. No recent change in weight. HEMATOLOGICAL: No anemia or easy bruising or bleeding. NEUROLOGIC: No headache, seizures, numbness, tingling or weakness. PSYCHIATRIC: No depression, no loss of interest in normal activity or change in sleep pattern.     Exam: chaperone present  BP 104/72   Ht 5' 5.75" (1.67 m)   Wt 166 lb (75.3 kg)   LMP 03/11/2014   BMI 27.00 kg/m   Body mass index is 27 kg/m.  General appearance : Well developed well nourished female. No acute distress HEENT: Eyes: no retinal hemorrhage or exudates,  Neck supple, trachea midline, no carotid bruits, no thyroidmegaly Lungs: Clear to auscultation, no rhonchi or wheezes, or rib retractions  Heart: Regular rate and rhythm, no murmurs or gallops Breast:Examined in sitting and supine position were symmetrical in appearance, no palpable masses or tenderness,  no skin retraction, no nipple inversion, no nipple discharge, no skin discoloration, no axillary or supraclavicular lymphadenopathy Abdomen: no palpable masses or tenderness, no rebound or guarding Extremities: no edema or skin discoloration or tenderness  Pelvic:  Bartholin, Urethra, Skene Glands: Within normal limits             Vagina: No gross lesions or discharge atrophic changes  Cervix: No gross lesions or discharge  Uterus  anteverted, normal size, shape and consistency, non-tender and mobile  Adnexa  Without masses or tenderness  Anus and perineum  normal   Rectovaginal  normal sphincter tone without palpated masses or tenderness  Hemoccult cards provided     Assessment/Plan:  51 y.o. female for annual exam will be prescribed Vagifem 10 g to apply intravaginally for her vaginal atrophy. Risk benefits and pros and cons were discussed. Also because of urinary discomfort after intercourse I'm going to prescribe for Macrobid to take 1 by mouth after intercourse for prevention of postcoital cystitis. Pap smear not indicated this year. She  was provided with fecal Hemoccult cards to submit to the office for testing. She will return to the office this week for fasting blood work consisting of fasting lipid profile along with comprehensive metabolic panel and CBC. She was reminded to schedule her mammogram in December this year but because of her saline implants she should have a three-dimensional mammogram. We discussed importance of calcium vitamin D and weightbearing exercises for osteoporosis prevention. Would recommend a bone density study next year.   Terrance Mass MD, 11:03 AM 04/04/2017

## 2017-04-04 NOTE — Patient Instructions (Signed)
Estradiol vaginal tablets What is this medicine? ESTRADIOL (es tra DYE ole) vaginal tablet is used to help relieve symptoms of vaginal irritation and dryness that occurs in some women during menopause. This medicine may be used for other purposes; ask your health care provider or pharmacist if you have questions. COMMON BRAND NAME(S): Vagifem, Yuvafem What should I tell my health care provider before I take this medicine? They need to know if you have any of these conditions: -abnormal vaginal bleeding -blood vessel disease or blood clots -breast, cervical, endometrial, ovarian, liver, or uterine cancer -dementia -diabetes -gallbladder disease -heart disease or recent heart attack -high blood pressure -high cholesterol -high level of calcium in the blood -hysterectomy -kidney disease -liver disease -migraine headaches -protein C deficiency -protein S deficiency -stroke -systemic lupus erythematosus (SLE) -tobacco smoker -an unusual or allergic reaction to estrogens, other hormones, medicines, foods, dyes, or preservatives -pregnant or trying to get pregnant -breast-feeding How should I use this medicine? This medicine is only for use in the vagina. Do not take by mouth. Wash and dry your hands before and after use. Read package directions carefully. Unwrap the applicator package. Be sure to use a new applicator for each dose. Use at the same time each day. If the tablet has fallen out of the applicator, but is still in the package, carefully place it back into the applicator. If the tablet has fallen out of the package, that applicator should be thrown out and you should use a new applicator containing a new tablet. Lie on your back, part and bend your knees. Gently insert the applicator as far as comfortably possible into the vagina. Then, gently press the plunger until the plunger is fully depressed. This will release the tablet into the vagina. Gently remove the applicator. Throw  away the applicator after use. Do not use your medicine more often than directed. Do not stop using except on the advice of your doctor or health care professional. Talk to your pediatrician regarding the use of this medicine in children. This medicine is not approved for use in children. A patient package insert for the product will be given with each prescription and refill. Read this sheet carefully each time. The sheet may change frequently. Overdosage: If you think you have taken too much of this medicine contact a poison control center or emergency room at once. NOTE: This medicine is only for you. Do not share this medicine with others. What if I miss a dose? If you miss a dose, take it as soon as you can. If it is almost time for your next dose, take only that dose. Do not take double or extra doses. What may interact with this medicine? Do not take this medicine with any of the following medications: -aromatase inhibitors like aminoglutethimide, anastrozole, exemestane, letrozole, testolactone This medicine may also interact with the following medications: -antibiotics used to treat tuberculosis like rifabutin, rifampin and rifapentene -raloxifene or tamoxifen -warfarin This list may not describe all possible interactions. Give your health care provider a list of all the medicines, herbs, non-prescription drugs, or dietary supplements you use. Also tell them if you smoke, drink alcohol, or use illegal drugs. Some items may interact with your medicine. What should I watch for while using this medicine? Visit your health care professional for regular checks on your progress. You will need a regular breast and pelvic exam. You should also discuss the need for regular mammograms with your health care professional, and follow his or her  guidelines. This medicine can make your body retain fluid, making your fingers, hands, or ankles swell. Your blood pressure can go up. Contact your doctor or  health care professional if you feel you are retaining fluid. If you have any reason to think you are pregnant; stop taking this medicine at once and contact your doctor or health care professional. Tobacco smoking increases the risk of getting a blood clot or having a stroke, especially if you are more than 53 years old. You are strongly advised not to smoke. If you wear contact lenses and notice visual changes, or if the lenses begin to feel uncomfortable, consult your eye care specialist. If you are going to have elective surgery, you may need to stop taking this medicine beforehand. Consult your health care professional for advice prior to scheduling the surgery. What side effects may I notice from receiving this medicine? Side effects that you should report to your doctor or health care professional as soon as possible: -allergic reactions like skin rash, itching or hives, swelling of the face, lips, or tongue -breast tissue changes or discharge -changes in vision -chest pain -confusion, trouble speaking or understanding -dark urine -general ill feeling or flu-like symptoms -light-colored stools -nausea, vomiting -pain, swelling, warmth in the leg -right upper belly pain -severe headaches -shortness of breath -sudden numbness or weakness of the face, arm or leg -trouble walking, dizziness, loss of balance or coordination -unusual vaginal bleeding -yellowing of the eyes or skin Side effects that usually do not require medical attention (report to your doctor or health care professional if they continue or are bothersome): -hair loss -increased hunger or thirst -increased urination -symptoms of vaginal infection like itching, irritation or unusual discharge -unusually weak or tired This list may not describe all possible side effects. Call your doctor for medical advice about side effects. You may report side effects to FDA at 1-800-FDA-1088. Where should I keep my medicine? Keep  out of the reach of children. Store at room temperature between 15 and 30 degrees C (59 and 86 degrees F). Throw away any unused medicine after the expiration date. NOTE: This sheet is a summary. It may not cover all possible information. If you have questions about this medicine, talk to your doctor, pharmacist, or health care provider.  2018 Elsevier/Gold Standard (2014-08-07 09:22:51)  

## 2017-04-08 ENCOUNTER — Other Ambulatory Visit: Payer: BLUE CROSS/BLUE SHIELD

## 2017-04-20 ENCOUNTER — Other Ambulatory Visit: Payer: Self-pay

## 2017-04-20 MED ORDER — OMEPRAZOLE MAGNESIUM 20 MG PO TBEC: 20.0000 mg | DELAYED_RELEASE_TABLET | Freq: Every day | ORAL | 4 refills | Status: DC

## 2017-04-20 NOTE — Telephone Encounter (Signed)
JF patient. Current on CE 04/04/17.

## 2017-07-14 ENCOUNTER — Encounter: Payer: Self-pay | Admitting: Obstetrics & Gynecology

## 2017-07-14 ENCOUNTER — Ambulatory Visit (INDEPENDENT_AMBULATORY_CARE_PROVIDER_SITE_OTHER): Payer: BLUE CROSS/BLUE SHIELD | Admitting: Obstetrics & Gynecology

## 2017-07-14 VITALS — BP 130/88

## 2017-07-14 DIAGNOSIS — N952 Postmenopausal atrophic vaginitis: Secondary | ICD-10-CM | POA: Diagnosis not present

## 2017-07-14 DIAGNOSIS — N9411 Superficial (introital) dyspareunia: Secondary | ICD-10-CM

## 2017-07-14 DIAGNOSIS — N898 Other specified noninflammatory disorders of vagina: Secondary | ICD-10-CM

## 2017-07-14 LAB — WET PREP FOR TRICH, YEAST, CLUE

## 2017-07-14 MED ORDER — TINIDAZOLE 500 MG PO TABS: 2.0000 g | ORAL_TABLET | Freq: Every day | ORAL | 0 refills | Status: AC

## 2017-07-14 MED ORDER — FLUCONAZOLE 150 MG PO TABS: 150.0000 mg | ORAL_TABLET | Freq: Every day | ORAL | 1 refills | Status: AC

## 2017-07-14 MED ORDER — ESTRADIOL 10 MCG VA TABS
1.0000 | ORAL_TABLET | VAGINAL | 5 refills | Status: DC
Start: 2017-07-14 — End: 2018-06-13

## 2017-07-14 NOTE — Progress Notes (Signed)
    Alexandra Coffey 14-Feb-1966 [id number]        51 y.o.  [id number]   RP:  Pain with Intercourse and vaginal dryness  HPI:  Continued pain and dryness with IC.  Was prescribed Vagifem last annual/gyn visit 04/04/2017 by Dr Toney Rakes, but didn't start it as she thought it was just a lubricant. Followed by [facility] for her menopausal symptoms whereby she is receiving testosterone palate's subcutaneous every 3 months.  No PMB.  No hot flushes.  No pelvic pain.  Past medical history,surgical history, problem list, medications, allergies, family history and social history were all reviewed and documented in the EPIC chart.  Directed ROS with pertinent positives and negatives documented in the history of present illness/assessment and plan.  Exam:  Vitals:   07/14/17 1126  BP: 130/88   General appearance:  Normal  Gyn exam:  Vulva atrophic.  Speculum: Cervix normal.  Atrophic vaginitis.  Abnormal vaginal d/c present, wet prep done.     Assessment/Plan:  51 y.o. G2P2002   1. Superficial dyspareunia Menopausal Atrophic Vaginitis.  Topical Vaginal/Vulvar Estrogen therapy reviewed.  Patient prefers Vagifem tablets rather than cream or ring.  Start on Vagifem generic.  Usage/benefits/risks reviewed.  Will use lubricant as well.  2. Post-menopause atrophic vaginitis As above.  Start on Vagifem generic.  3. Vaginal discharge Bacterial vaginosis and Yeast vaginitis.  Will treat with Tinidazole and then Fluconazole.  Usage of medications reviewed. - WET PREP FOR [place], YEAST, CLUE  Counseling on above issues >50% x 25 minutes  Alexandra Bruins MD, 11:41 AM 07/14/2017

## 2017-07-17 ENCOUNTER — Encounter: Payer: Self-pay | Admitting: Obstetrics & Gynecology

## 2017-07-17 NOTE — Patient Instructions (Addendum)
1. Superficial dyspareunia Menopausal Atrophic Vaginitis.  Topical Vaginal/Vulvar Estrogen therapy reviewed.  Patient prefers Vagifem tablets rather than cream or ring.  Start on Vagifem generic.  Usage/benefits/risks reviewed.  Will use lubricant as well.  2. Post-menopause atrophic vaginitis As above.  Start on Vagifem generic.  3. Vaginal discharge Bacterial vaginosis and Yeast vaginitis.  Will treat with Tinidazole and then Fluconazole.  Usage of medications reviewed. - WET PREP FOR [place], YEAST, CLUE  Alphonsine, it was a pleasure meeting you today!    Atrophic Vaginitis Atrophic vaginitis is a condition in which the tissues that line the vagina become dry and thin. This condition is most common in women who have stopped having regular menstrual periods (menopause). This usually starts when a woman is 4-26 years old. Estrogen helps to keep the vagina moist. It stimulates the vagina to produce a clear fluid that lubricates the vagina for sexual intercourse. This fluid also protects the vagina from infection. Lack of estrogen can cause the lining of the vagina to get thinner and dryer. The vagina may also shrink in size. It may become less elastic. Atrophic vaginitis tends to get worse over time as a woman's estrogen level drops. What are the causes? This condition is caused by the normal drop in estrogen that happens around the time of menopause. What increases the risk? Certain conditions or situations may lower a woman's estrogen level, which increases her risk of atrophic vaginitis. These include:  Taking medicine that blocks estrogen.  Having ovaries removed surgically.  Being treated for cancer with X-ray treatment (radiation) or medicines (chemotherapy).  Exercising very hard and often.  Having an eating disorder (anorexia).  Giving birth or breastfeeding.  Being over the age of 16.  Smoking.  What are the signs or symptoms? Symptoms of this condition include:  Pain,  soreness, or bleeding during sexual intercourse (dyspareunia).  Vaginal burning, irritation, or itching.  Pain or bleeding during a vaginal examination using a speculum (pelvic exam).  Loss of interest in sexual activity.  Having burning pain when passing urine.  Vaginal discharge that is brown or yellow.  In some cases, there are no symptoms. How is this diagnosed? This condition is diagnosed with a medical history and physical exam. This will include a pelvic exam that checks whether the inside of your vagina appears pale, thin, or dry. Rarely, you may also have other tests, including:  A urine test.  A test that checks the acid balance in your vaginal fluid (acid balance test).  How is this treated? Treatment for this condition may depend on the severity of your symptoms. Treatment may include:  Using an over-the-counter vaginal lubricant before you have sexual intercourse.  Using a long-acting vaginal moisturizer.  Using low-dose vaginal estrogen for moderate to severe symptoms that do not respond to other treatments. Options include creams, tablets, and inserts (vaginal rings). Before using vaginal estrogen, tell your health care provider if you have a history of: ? Breast cancer. ? Endometrial cancer. ? Blood clots.  Taking medicines. You may be able to take a daily pill for dyspareunia. Discuss all of the risks of this medicine with your health care provider. It is usually not recommended for women who have a family history or personal history of breast cancer.  If your symptoms are very mild and you are not sexually active, you may not need treatment. Follow these instructions at home:  Take medicines only as directed by your health care provider. Do not use herbal or  alternative medicines unless your health care provider says that you can.  Use over-the-counter creams, lubricants, or moisturizers for dryness only as directed by your health care provider.  If your  atrophic vaginitis is caused by menopause, discuss all of your menopausal symptoms and treatment options with your health care provider.  Do not douche.  Do not use products that can make your vagina dry. These include: ? Scented feminine sprays. ? Scented tampons. ? Scented soaps.  If it hurts to have sex, talk with your sexual partner. Contact a health care provider if:  Your discharge looks different than normal.  Your vagina has an unusual smell.  You have new symptoms.  Your symptoms do not improve with treatment.  Your symptoms get worse. This information is not intended to replace advice given to you by your health care provider. Make sure you discuss any questions you have with your health care provider. Document Released: 01/07/2015 Document Revised: 01/29/2016 Document Reviewed: 08/14/2014 Elsevier Interactive Patient Education  2018 [employer].  Vaginal Yeast infection, Adult Vaginal yeast infection is a condition that causes soreness, swelling, and redness (inflammation) of the vagina. It also causes vaginal discharge. This is a common condition. Some women get this infection frequently. What are the causes? This condition is caused by a change in the normal balance of the yeast (candida) and bacteria that live in the vagina. This change causes an overgrowth of yeast, which causes the inflammation. What increases the risk? This condition is more likely to develop in:  Women who take antibiotic medicines.  Women who have diabetes.  Women who take birth control pills.  Women who are pregnant.  Women who douche often.  Women who have a weak defense (immune) system.  Women who have been taking steroid medicines for a long time.  Women who frequently wear tight clothing.  What are the signs or symptoms? Symptoms of this condition include:  White, thick vaginal discharge.  Swelling, itching, redness, and irritation of the vagina. The lips of the vagina  (vulva) may be affected as well.  Pain or a burning feeling while urinating.  Pain during sex.  How is this diagnosed? This condition is diagnosed with a medical history and physical exam. This will include a pelvic exam. Your health care provider will examine a sample of your vaginal discharge under a microscope. Your health care provider may send this sample for testing to confirm the diagnosis. How is this treated? This condition is treated with medicine. Medicines may be over-the-counter or prescription. You may be told to use one or more of the following:  Medicine that is taken orally.  Medicine that is applied as a cream.  Medicine that is inserted directly into the vagina (suppository).  Follow these instructions at home:  Take or apply over-the-counter and prescription medicines only as told by your health care provider.  Do not have sex until your health care provider has approved. Tell your sex partner that you have a yeast infection. That person should go to his or her health care provider if he or she develops symptoms.  Do not wear tight clothes, such as pantyhose or tight pants.  Avoid using tampons until your health care provider approves.  Eat more yogurt. This may help to keep your yeast infection from returning.  Try taking a sitz bath to help with discomfort. This is a warm water bath that is taken while you are sitting down. The water should only come up to your hips  and should cover your buttocks. Do this 3-4 times per day or as told by your health care provider.  Do not douche.  Wear breathable, cotton underwear.  If you have diabetes, keep your blood sugar levels under control. Contact a health care provider if:  You have a fever.  Your symptoms go away and then return.  Your symptoms do not get better with treatment.  Your symptoms get worse.  You have new symptoms.  You develop blisters in or around your vagina.  You have blood coming from  your vagina and it is not your menstrual period.  You develop pain in your abdomen. This information is not intended to replace advice given to you by your health care provider. Make sure you discuss any questions you have with your health care provider. Document Released: 06/02/2005 Document Revised: 02/04/2016 Document Reviewed: 02/24/2015 Elsevier Interactive Patient Education  2018 [employer].  Bacterial Vaginosis Bacterial vaginosis is a vaginal infection that occurs when the normal balance of bacteria in the vagina is disrupted. It results from an overgrowth of certain bacteria. This is the most common vaginal infection among women ages 81-44. Because bacterial vaginosis increases your risk for STIs (sexually transmitted infections), getting treated can help reduce your risk for chlamydia, gonorrhea, herpes, and HIV (human immunodeficiency virus). Treatment is also important for preventing complications in pregnant women, because this condition can cause an early (premature) delivery. What are the causes? This condition is caused by an increase in harmful bacteria that are normally present in small amounts in the vagina. However, the reason that the condition develops is not fully understood. What increases the risk? The following factors may make you more likely to develop this condition:  Having a new sexual partner or multiple sexual partners.  Having unprotected sex.  Douching.  Having an intrauterine device (IUD).  Smoking.  Drug and alcohol abuse.  Taking certain antibiotic medicines.  Being pregnant.  You cannot get bacterial vaginosis from toilet seats, bedding, swimming pools, or contact with objects around you. What are the signs or symptoms? Symptoms of this condition include:  Grey or white vaginal discharge. The discharge can also be watery or foamy.  A fish-like odor with discharge, especially after sexual intercourse or during menstruation.  Itching in  and around the vagina.  Burning or pain with urination.  Some women with bacterial vaginosis have no signs or symptoms. How is this diagnosed? This condition is diagnosed based on:  Your medical history.  A physical exam of the vagina.  Testing a sample of vaginal fluid under a microscope to look for a large amount of bad bacteria or abnormal cells. Your health care provider may use a cotton swab or a small wooden spatula to collect the sample.  How is this treated? This condition is treated with antibiotics. These may be given as a pill, a vaginal cream, or a medicine that is put into the vagina (suppository). If the condition comes back after treatment, a second round of antibiotics may be needed. Follow these instructions at home: Medicines  Take over-the-counter and prescription medicines only as told by your health care provider.  Take or use your antibiotic as told by your health care provider. Do not stop taking or using the antibiotic even if you start to feel better. General instructions  If you have a female sexual partner, tell her that you have a vaginal infection. She should see her health care provider and be treated if she has symptoms. If  you have a female sexual partner, he does not need treatment.  During treatment: ? Avoid sexual activity until you finish treatment. ? Do not douche. ? Avoid alcohol as directed by your health care provider. ? Avoid breastfeeding as directed by your health care provider.  Drink enough water and fluids to keep your urine clear or pale yellow.  Keep the area around your vagina and rectum clean. ? Wash the area daily with warm water. ? Wipe yourself from front to back after using the toilet.  Keep all follow-up visits as told by your health care provider. This is important. How is this prevented?  Do not douche.  Wash the outside of your vagina with warm water only.  Use protection when having sex. This includes latex condoms  and dental dams.  Limit how many sexual partners you have. To help prevent bacterial vaginosis, it is best to have sex with just one partner (monogamous).  Make sure you and your sexual partner are tested for STIs.  Wear cotton or cotton-lined underwear.  Avoid wearing tight pants and pantyhose, especially during summer.  Limit the amount of alcohol that you drink.  Do not use any products that contain nicotine or tobacco, such as cigarettes and e-cigarettes. If you need help quitting, ask your health care provider.  Do not use illegal drugs. Where to find more information:  Centers for Disease Control and Prevention: AppraiserFraud.fi  American Sexual Health Association (ASHA): www.ashastd.org  [place].S. Department of Health and Financial controller, Office on Women's Health: DustingSprays.pl or SecuritiesCard.it Contact a health care provider if:  Your symptoms do not improve, even after treatment.  You have more discharge or pain when urinating.  You have a fever.  You have pain in your abdomen.  You have pain during sex.  You have vaginal bleeding between periods. Summary  Bacterial vaginosis is a vaginal infection that occurs when the normal balance of bacteria in the vagina is disrupted.  Because bacterial vaginosis increases your risk for STIs (sexually transmitted infections), getting treated can help reduce your risk for chlamydia, gonorrhea, herpes, and HIV (human immunodeficiency virus). Treatment is also important for preventing complications in pregnant women, because the condition can cause an early (premature) delivery.  This condition is treated with antibiotic medicines. These may be given as a pill, a vaginal cream, or a medicine that is put into the vagina (suppository). This information is not intended to replace advice given to you by your health care provider. Make sure you discuss any questions you have with your  health care provider. Document Released: 08/23/2005 Document Revised: 05/08/2016 Document Reviewed: 05/08/2016 Elsevier Interactive Patient Education  2017 [employer].

## 2018-03-07 ENCOUNTER — Encounter: Payer: Self-pay | Admitting: Anesthesiology

## 2018-04-06 ENCOUNTER — Encounter: Payer: BLUE CROSS/BLUE SHIELD | Admitting: Gynecology

## 2018-06-13 ENCOUNTER — Encounter: Payer: Self-pay | Admitting: Obstetrics & Gynecology

## 2018-06-13 ENCOUNTER — Ambulatory Visit (INDEPENDENT_AMBULATORY_CARE_PROVIDER_SITE_OTHER): Payer: BLUE CROSS/BLUE SHIELD | Admitting: Obstetrics & Gynecology

## 2018-06-13 VITALS — BP 126/84 | Ht 65.5 in | Wt 175.0 lb

## 2018-06-13 DIAGNOSIS — Z79890 Hormone replacement therapy (postmenopausal): Secondary | ICD-10-CM

## 2018-06-13 DIAGNOSIS — Z13820 Encounter for screening for osteoporosis: Secondary | ICD-10-CM | POA: Diagnosis not present

## 2018-06-13 DIAGNOSIS — N951 Menopausal and female climacteric states: Secondary | ICD-10-CM | POA: Diagnosis not present

## 2018-06-13 DIAGNOSIS — Z01419 Encounter for gynecological examination (general) (routine) without abnormal findings: Secondary | ICD-10-CM | POA: Diagnosis not present

## 2018-06-13 NOTE — Progress Notes (Signed)
[facility] Dec 06, 1965 [id number]   History:    51 y.o. G2P2L2 Married  RP:  Established patient presenting for annual gyn exam   HPI: On Estrogen/Testosterone Pellets and Progesterone. No PMB.  No pelvic pain.  No pain with IC anymore. Recent fracture of Left foot with minor fall.  Will schedule BD here.  Breast augmentation, no breast pain/lump.  Urine and bowel movements normal.  Body mass index 28.68.  Health labs with family physician.  Colonoscopy in 2016.  Past medical history,surgical history, family history and social history were all reviewed and documented in the EPIC chart.  Gynecologic History Patient's last menstrual period was 03/11/2014. Contraception: post menopausal status Last Pap: August 2017. Results were: Negative Last mammogram: 2019. Results were: Normal per patient Bone Density: Never Colonoscopy: 2016  Obstetric History OB History  Gravida Para Term Preterm AB Living  2 2 2     2   SAB TAB Ectopic Multiple Live Births          2    # Outcome Date GA Lbr Len/2nd Weight Sex Delivery Anes PTL Lv  2 Term     F CS-Unspec  N LIV  1 Term     F CS-Unspec  N LIV     ROS: A ROS was performed and pertinent positives and negatives are included in the history.  GENERAL: No fevers or chills. HEENT: No change in vision, no earache, sore throat or sinus congestion. NECK: No pain or stiffness. CARDIOVASCULAR: No chest pain or pressure. No palpitations. PULMONARY: No shortness of breath, cough or wheeze. GASTROINTESTINAL: No abdominal pain, nausea, vomiting or diarrhea, melena or bright red blood per rectum. GENITOURINARY: No urinary frequency, urgency, hesitancy or dysuria. MUSCULOSKELETAL: No joint or muscle pain, no back pain, no recent trauma. DERMATOLOGIC: No rash, no itching, no lesions. ENDOCRINE: No polyuria, polydipsia, no heat or cold intolerance. No recent change in weight. HEMATOLOGICAL: No anemia or easy bruising or bleeding. NEUROLOGIC: No headache,  seizures, numbness, tingling or weakness. PSYCHIATRIC: No depression, no loss of interest in normal activity or change in sleep pattern.     Exam:   BP 126/84   Ht 5' 5.5" (1.664 m)   Wt 175 lb (79.4 kg)   LMP 03/11/2014   BMI 28.68 kg/m   Body mass index is 28.68 kg/m.  General appearance : Well developed well nourished female. No acute distress HEENT: Eyes: no retinal hemorrhage or exudates,  Neck supple, trachea midline, no carotid bruits, no thyroidmegaly Lungs: Clear to auscultation, no rhonchi or wheezes, or rib retractions  Heart: Regular rate and rhythm, no murmurs or gallops Breast:Examined in sitting and supine position were symmetrical in appearance, no palpable masses or tenderness,  no skin retraction, no nipple inversion, no nipple discharge, no skin discoloration, no axillary or supraclavicular lymphadenopathy Abdomen: no palpable masses or tenderness, no rebound or guarding Extremities: no edema or skin discoloration or tenderness  Pelvic: Vulva: Normal             Vagina: No gross lesions or discharge  Cervix: No gross lesions or discharge.  Pap reflex done  Uterus  AV, normal size, shape and consistency, non-tender and mobile  Adnexa  Without masses or tenderness  Anus: Normal   Assessment/Plan:  52 y.o. female for annual exam   1. Encounter for routine gynecological examination with Papanicolaou smear of cervix Norm index al gynecologic exam.  Pap reflex done.  Breast exam normal.  Will obtain report of screening mammogram  done in 2019.  Health labs with family physician.  Body mass index at 28.68.  Recommend lower calorie/carb diet such as [employer] and aerobic physical activity 5 times a week with weightlifting every 2 days.  2. Menopausal syndrome on hormone replacement therapy Will continue hormone replacement therapy through her integrative physician.  Estrogen and testosterone pellets as well as progesterone therapy.  No postmenopausal bleeding.   Recommend taking the lowest dosage of hormone replacement therapy that controls her symptoms of menopause.  3. Screening for osteoporosis Follow-up here for bone density.  Vitamin D supplements, calcium intake of 1.5 g/day and regular weightbearing physical activity recommended. - DG Bone Density; Future  Princess Bruins MD, 2:31 PM 06/13/2018

## 2018-06-18 ENCOUNTER — Encounter: Payer: Self-pay | Admitting: Obstetrics & Gynecology

## 2018-06-18 NOTE — Patient Instructions (Signed)
1. Encounter for routine gynecological examination with Papanicolaou smear of cervix Norm index al gynecologic exam.  Pap reflex done.  Breast exam normal.  Will obtain report of screening mammogram done in 2019.  Health labs with family physician.  Body mass index at 28.68.  Recommend lower calorie/carb diet such as [employer] and aerobic physical activity 5 times a week with weightlifting every 2 days.  2. Menopausal syndrome on hormone replacement therapy Will continue hormone replacement therapy through her integrative physician.  Estrogen and testosterone pellets as well as progesterone therapy.  No postmenopausal bleeding.  Recommend taking the lowest dosage of hormone replacement therapy that controls her symptoms of menopause.  3. Screening for osteoporosis Follow-up here for bone density.  Vitamin D supplements, calcium intake of 1.5 g/day and regular weightbearing physical activity recommended. - DG Bone Density; Future  Danity, it was a pleasure seeing you today!  I will inform you of your results as soon as they are available.

## 2018-06-21 LAB — PAP IG W/ RFLX HPV ASCU

## 2018-06-27 ENCOUNTER — Other Ambulatory Visit: Payer: Self-pay | Admitting: Obstetrics & Gynecology

## 2018-06-27 ENCOUNTER — Ambulatory Visit (INDEPENDENT_AMBULATORY_CARE_PROVIDER_SITE_OTHER): Payer: BLUE CROSS/BLUE SHIELD

## 2018-06-27 DIAGNOSIS — Z13820 Encounter for screening for osteoporosis: Secondary | ICD-10-CM | POA: Diagnosis not present

## 2018-06-27 DIAGNOSIS — Z780 Asymptomatic menopausal state: Secondary | ICD-10-CM

## 2018-07-10 ENCOUNTER — Telehealth: Payer: Self-pay | Admitting: *Deleted

## 2018-07-10 NOTE — Telephone Encounter (Signed)
Patient informed with the below.  

## 2018-07-10 NOTE — Telephone Encounter (Signed)
-----   Message from Princess Bruins, MD sent at 07/03/2018  7:56 AM EDT ----- Regarding: Bone Density Bone Density normal.  Continue with vitamin D supplements, calcium intake of 1.5 g/day and regular weightbearing physical activity.  Will repeat bone density in 5 years.

## 2018-07-15 ENCOUNTER — Other Ambulatory Visit: Payer: Self-pay | Admitting: Gynecology

## 2018-07-17 NOTE — Telephone Encounter (Signed)
It appears Dr. Toney Rakes has been filling Rx, are you willing to due the same, annual exam was on 06/13/18

## 2018-10-12 ENCOUNTER — Other Ambulatory Visit: Payer: Self-pay | Admitting: Orthopaedic Surgery

## 2018-10-12 DIAGNOSIS — M79672 Pain in left foot: Secondary | ICD-10-CM

## 2018-10-13 ENCOUNTER — Ambulatory Visit
Admission: RE | Admit: 2018-10-13 | Discharge: 2018-10-13 | Disposition: A | Discharge status: Home / Self Care | Payer: BLUE CROSS/BLUE SHIELD | Source: Ambulatory Visit | Attending: Orthopaedic Surgery | Admitting: Orthopaedic Surgery

## 2018-10-13 DIAGNOSIS — M79672 Pain in left foot: Secondary | ICD-10-CM

## 2018-11-24 ENCOUNTER — Other Ambulatory Visit: Payer: Self-pay

## 2019-05-30 ENCOUNTER — Encounter: Payer: Self-pay | Admitting: Gynecology

## 2019-12-15 ENCOUNTER — Encounter (HOSPITAL_BASED_OUTPATIENT_CLINIC_OR_DEPARTMENT_OTHER): Payer: Self-pay | Admitting: Emergency Medicine

## 2019-12-15 ENCOUNTER — Other Ambulatory Visit: Payer: Self-pay

## 2019-12-15 ENCOUNTER — Emergency Department (HOSPITAL_BASED_OUTPATIENT_CLINIC_OR_DEPARTMENT_OTHER)
Admission: EM | Admit: 2019-12-15 | Discharge: 2019-12-15 | Disposition: A | Discharge status: Home / Self Care | Payer: BC Managed Care – PPO | Attending: Emergency Medicine | Admitting: Emergency Medicine

## 2019-12-15 DIAGNOSIS — Z87891 Personal history of nicotine dependence: Secondary | ICD-10-CM | POA: Insufficient documentation

## 2019-12-15 DIAGNOSIS — U071 COVID-19: Secondary | ICD-10-CM | POA: Diagnosis not present

## 2019-12-15 DIAGNOSIS — B349 Viral infection, unspecified: Secondary | ICD-10-CM

## 2019-12-15 DIAGNOSIS — Z79899 Other long term (current) drug therapy: Secondary | ICD-10-CM | POA: Diagnosis not present

## 2019-12-15 DIAGNOSIS — Z885 Allergy status to narcotic agent status: Secondary | ICD-10-CM | POA: Diagnosis not present

## 2019-12-15 DIAGNOSIS — R509 Fever, unspecified: Secondary | ICD-10-CM | POA: Diagnosis present

## 2019-12-15 DIAGNOSIS — Z20822 Contact with and (suspected) exposure to covid-19: Secondary | ICD-10-CM

## 2019-12-15 DIAGNOSIS — Z882 Allergy status to sulfonamides status: Secondary | ICD-10-CM | POA: Diagnosis not present

## 2019-12-15 LAB — SARS CORONAVIRUS 2 (TAT 6-24 HRS): SARS Coronavirus 2: POSITIVE — AB

## 2019-12-15 MED ORDER — ONDANSETRON 4 MG PO TBDP: 4.0000 mg | ORAL_TABLET | Freq: Three times a day (TID) | ORAL | 0 refills | Status: DC | PRN

## 2019-12-15 NOTE — ED Notes (Signed)
Patient ambulated in room. SAT 96% entire time. SAT actually a bit lower when in bed = 92%. No SOB noted, but patient stated she was just exhausted

## 2019-12-15 NOTE — ED Triage Notes (Signed)
Fever, fatigue, and chills x 3 days. Husband is COVID+

## 2019-12-15 NOTE — Discharge Instructions (Addendum)
I strongly suspect that you have COVID-19.  If you have any new or concerning symptoms please return to ED for reevaluation as we discussed.  Your COVID test is pending; you should expect results in 2-3 days. You can access your results on your MyChart--if you test positive you should receive a phone call.  In the meantime follow CDC guidelines and quarantine, wear a mask, wash hands often.   Please take over the counter vitamin D 2000-4000 units per day. I also recommend zinc 50 mg per day for the next two weeks. Please use Tylenol or ibuprofen for pain/fever.  You may use 600 mg ibuprofen every 6 hours or 1000 mg of Tylenol every 6 hours.  You may choose to alternate between the 2.  This would be most effective.  Not to exceed 4 g of Tylenol within 24 hours.  Not to exceed 3200 mg ibuprofen 24 hours.   Please return to ED if you feel have difficulty breathing or have emergent, new or concerning symptoms.  Patients who have symptoms consistent with COVID-19 should self isolated for: At least 3 days (72 hours) have passed since recovery, defined as resolution of fever without the use of fever reducing medications and improvement in respiratory symptoms (e.g., cough, shortness of breath), and At least 7 days have passed since symptoms first appeared.       Person Under Monitoring Name: Alexandra Coffey  Location: [address] [place] [id number]   Infection Prevention Recommendations for Individuals Confirmed to have, or Being Evaluated for, 2019 Novel Coronavirus (COVID-19) Infection Who Receive Care at Home  Individuals who are confirmed to have, or are being evaluated for, COVID-19 should follow the prevention steps below until a healthcare provider or local or state health department says they can return to normal activities.  Stay home except to get medical care You should restrict activities outside your home, except for getting medical care. Do not go to work, school, or  public areas, and do not use public transportation or taxis.  Call ahead before visiting your doctor Before your medical appointment, call the healthcare provider and tell them that you have, or are being evaluated for, COVID-19 infection. This will help the healthcare provider's office take steps to keep other people from getting infected. Ask your healthcare provider to call the local or state health department.  Monitor your symptoms Seek prompt medical attention if your illness is worsening (e.g., difficulty breathing). Before going to your medical appointment, call the healthcare provider and tell them that you have, or are being evaluated for, COVID-19 infection. Ask your healthcare provider to call the local or state health department.  Wear a facemask You should wear a facemask that covers your nose and mouth when you are in the same room with other people and when you visit a healthcare provider. People who live with or visit you should also wear a facemask while they are in the same room with you.  Separate yourself from other people in your home As much as possible, you should stay in a different room from other people in your home. Also, you should use a separate bathroom, if available.  Avoid sharing household items You should not share dishes, drinking glasses, cups, eating utensils, towels, bedding, or other items with other people in your home. After using these items, you should wash them thoroughly with soap and water.  Cover your coughs and sneezes Cover your mouth and nose with a tissue when you cough  or sneeze, or you can cough or sneeze into your sleeve. Throw used tissues in a lined trash can, and immediately wash your hands with soap and water for at least 20 seconds or use an alcohol-based hand rub.  Wash your [facility] your hands often and thoroughly with soap and water for at least 20 seconds. You can use an alcohol-based hand sanitizer if soap and water  are not available and if your hands are not visibly dirty. Avoid touching your eyes, nose, and mouth with unwashed hands.   Prevention Steps for Caregivers and Household Members of Individuals Confirmed to have, or Being Evaluated for, COVID-19 Infection Being Cared for in the Home  If you live with, or provide care at home for, a person confirmed to have, or being evaluated for, COVID-19 infection please follow these guidelines to prevent infection:  Follow healthcare provider's instructions Make sure that you understand and can help the patient follow any healthcare provider instructions for all care.  Provide for the patient's basic needs You should help the patient with basic needs in the home and provide support for getting groceries, prescriptions, and other personal needs.  Monitor the patient's symptoms If they are getting sicker, call his or her medical provider and tell them that the patient has, or is being evaluated for, COVID-19 infection. This will help the healthcare provider's office take steps to keep other people from getting infected. Ask the healthcare provider to call the local or state health department.  Limit the number of people who have contact with the patient If possible, have only one caregiver for the patient. Other household members should stay in another home or place of residence. If this is not possible, they should stay in another room, or be separated from the patient as much as possible. Use a separate bathroom, if available. Restrict visitors who do not have an essential need to be in the home.  Keep older adults, very young children, and other sick people away from the patient Keep older adults, very young children, and those who have compromised immune systems or chronic health conditions away from the patient. This includes people with chronic heart, lung, or kidney conditions, diabetes, and cancer.  Ensure good ventilation Make sure that  shared spaces in the home have good air flow, such as from an air conditioner or an opened window, weather permitting.  Wash your hands often Wash your hands often and thoroughly with soap and water for at least 20 seconds. You can use an alcohol based hand sanitizer if soap and water are not available and if your hands are not visibly dirty. Avoid touching your eyes, nose, and mouth with unwashed hands. Use disposable paper towels to dry your hands. If not available, use dedicated cloth towels and replace them when they become wet.  Wear a facemask and gloves Wear a disposable facemask at all times in the room and gloves when you touch or have contact with the patient's blood, body fluids, and/or secretions or excretions, such as sweat, saliva, sputum, nasal mucus, vomit, urine, or feces.  Ensure the mask fits over your nose and mouth tightly, and do not touch it during use. Throw out disposable facemasks and gloves after using them. Do not reuse. Wash your hands immediately after removing your facemask and gloves. If your personal clothing becomes contaminated, carefully remove clothing and launder. Wash your hands after handling contaminated clothing. Place all used disposable facemasks, gloves, and other waste in a lined container before  disposing them with other household waste. Remove gloves and wash your hands immediately after handling these items.  Do not share dishes, glasses, or other household items with the patient Avoid sharing household items. You should not share dishes, drinking glasses, cups, eating utensils, towels, bedding, or other items with a patient who is confirmed to have, or being evaluated for, COVID-19 infection. After the person uses these items, you should wash them thoroughly with soap and water.  Wash laundry thoroughly Immediately remove and wash clothes or bedding that have blood, body fluids, and/or secretions or excretions, such as sweat, saliva, sputum,  nasal mucus, vomit, urine, or feces, on them. Wear gloves when handling laundry from the patient. Read and follow directions on labels of laundry or clothing items and detergent. In general, wash and dry with the warmest temperatures recommended on the label.  Clean all areas the individual has used often Clean all touchable surfaces, such as counters, tabletops, doorknobs, bathroom fixtures, toilets, phones, keyboards, tablets, and bedside tables, every day. Also, clean any surfaces that may have blood, body fluids, and/or secretions or excretions on them. Wear gloves when cleaning surfaces the patient has come in contact with. Use a diluted bleach solution (e.g., dilute bleach with 1 part bleach and 10 parts water) or a household disinfectant with a label that says EPA-registered for coronaviruses. To make a bleach solution at home, add 1 tablespoon of bleach to 1 quart (4 cups) of water. For a larger supply, add  cup of bleach to 1 gallon (16 cups) of water. Read labels of cleaning products and follow recommendations provided on product labels. Labels contain instructions for safe and effective use of the cleaning product including precautions you should take when applying the product, such as wearing gloves or eye protection and making sure you have good ventilation during use of the product. Remove gloves and wash hands immediately after cleaning.  Monitor yourself for signs and symptoms of illness Caregivers and household members are considered close contacts, should monitor their health, and will be asked to limit movement outside of the home to the extent possible. Follow the monitoring steps for close contacts listed on the symptom monitoring form.   ? If you have additional questions, contact your local health department or call the epidemiologist on call at [phone] (available 24/7). ? This guidance is subject to change. For the most up-to-date guidance from [facility], please refer to  their website: [email or web address]

## 2019-12-15 NOTE — ED Provider Notes (Signed)
[facility] EMERGENCY DEPARTMENT Provider Note   CSN: [id number] Arrival date & time: 12/15/19  [id number]     History Chief Complaint  Patient presents with  . Fever    Alexandra Coffey is a 54 y.o. female.  HPI  Patient is a 54 year old female with no significant past medical history apart from seasonal allergies and reflux.  Patient presents today for 5 days of fevers, chills, malaise, fatigue, decreased appetite.  She states that her husband who she lives with is Covid positive.  He was symptomatic several days before being tested and finding a + test on Wednesday of last week.  Patient states that she has no abdominal pain, chest pain, shortness of breath, coughing, congestion, urinary frequency, urgency, dysuria hematuria, diarrhea back or neck pain.  She has no headache, altered mental status vision changes or agitation.  She states that she has been eating less but has been able to eat and drink without difficulty.  She states she has decreased appetite.  She denies any dyspnea on exertion or heart palpitations.  Patient states that her T-max was 102 but generally stays at 100.  She states that she has been using Tylenol twice daily.  Is taking the other medications for her symptoms.     Past Medical History:  Diagnosis Date  . Allergy    SEASONAL  . Broken bones    ankles  . GERD (gastroesophageal reflux disease)   . 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  . 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 Shingles     Patient Active Problem List   Diagnosis Date Noted  . Vaginal odor 12/13/2016  . Vaginitis and vulvovaginitis 12/13/2016  . PMB (postmenopausal bleeding) 08/04/2016  . Esophageal reflux 07/08/2015  . Menopause 05/18/2012  . Weight gain 03/08/2012  . Family history of diabetes mellitus 03/08/2012    Past Surgical History:  Procedure Laterality Date  . ABDOMINOPLASTY    . AUGMENTATION MAMMAPLASTY  2003   SALINE  . LAPAROSCOPIC  CHOLECYSTECTOMY  1995  . ORIF ANKLE FRACTURE  2005     OB History    Gravida  2   Para  2   Term  2   Preterm      AB      Living  2     SAB      TAB      Ectopic      Multiple      Live Births  2           Family History  Problem Relation Age of Onset  . Diabetes Mother   . Osteoporosis Mother   . Hyperlipidemia Mother   . Dementia Mother   . Cancer Father        esophageal   . Colon cancer Neg Hx     Social History   Tobacco Use  . Smoking status: Former Smoker    Quit date: 03/09/2007    Years since quitting: 12.7  . Smokeless tobacco: Never Used  Substance Use Topics  . Alcohol use: No    Alcohol/week: 0.0 standard drinks  . Drug use: No    Home Medications Prior to Admission medications   Medication Sig Start Date End Date Taking? Authorizing Provider  ALPRAZolam Duanne Moron) 0.5 MG tablet Take 0.5 mg by mouth at bedtime as needed.      [provider]  omeprazole (PRILOSEC) 20 MG capsule TAKE 1  CAPSULE BY MOUTH EVERY DAY 07/17/18   Princess Bruins, MD  ondansetron (ZOFRAN ODT) 4 MG disintegrating tablet Take 1 tablet (4 mg total) by mouth every 8 (eight) hours as needed for nausea or vomiting. 12/15/19   Tedd Sias, PA  UNABLE TO FIND Hormone replacement-    [provider]    Allergies    Codeine and Sulfa antibiotics  Review of Systems   Review of Systems  Constitutional: Positive for appetite change, chills, fatigue and fever.  HENT: Negative for congestion.   Eyes: Negative for pain.  Respiratory: Negative for cough and shortness of breath.   Cardiovascular: Negative for chest pain and leg swelling.  Gastrointestinal: Negative for abdominal pain and vomiting.  Genitourinary: Negative for dysuria.  Musculoskeletal: Negative for myalgias.  Skin: Negative for rash.  Neurological: Negative for dizziness and headaches.    Physical Exam Updated Vital Signs BP 106/76 (BP Location: Left Arm)   Pulse 74   Temp  98.4 F (36.9 C) (Oral)   Resp 18   Ht 5\' 5"  (1.651 m)   Wt 77.1 kg   LMP 03/11/2014   SpO2 94%   BMI 28.29 kg/m   Physical Exam Vitals and nursing note reviewed.  Constitutional:      General: She is not in acute distress. HENT:     Head: Normocephalic and atraumatic.     Nose: Nose normal.     Mouth/Throat:     Mouth: Mucous membranes are moist.  Eyes:     General: No scleral icterus. Cardiovascular:     Rate and Rhythm: Normal rate and regular rhythm.     Pulses: Normal pulses.     Heart sounds: Normal heart sounds.     Comments: Heart regular rate and rhythm.  Pulses symmetric bilateral upper and lower extremities. Pulmonary:     Effort: Pulmonary effort is normal. No respiratory distress.     Breath sounds: Rales (faint crackles) present.     Comments: Faint crackles auscultated in bilateral bases which resolve after ambulation Abdominal:     Palpations: Abdomen is soft.     Tenderness: There is no abdominal tenderness.     Comments: No abdominal tenderness, guarding, rebound.  Negative Murphy sign, negative Rovsing, negative McBurney, no CVA tenderness.  Musculoskeletal:     Cervical back: Normal range of motion.     Right lower leg: No edema.     Left lower leg: No edema.  Skin:    General: Skin is warm and dry.     Capillary Refill: Capillary refill takes less than 2 seconds.  Neurological:     Mental Status: She is alert and oriented to person, place, and time. Mental status is at baseline.     Comments: Ambulatory, no cranial nerve deficits, sensation intact bilateral upper and lower extremities and face.  Psychiatric:        Mood and Affect: Mood normal.        Behavior: Behavior normal.     ED Results / Procedures / Treatments   Labs (all labs ordered are listed, but only abnormal results are displayed) Labs Reviewed  SARS CORONAVIRUS 2 (TAT 6-24 HRS)    EKG None  Radiology No results found.  Procedures Procedures (including critical care  time)  Medications Ordered in ED Medications - No data to display  ED Course  I have reviewed the triage vital signs and the nursing notes.  Pertinent labs & imaging results that were available during my care of  the patient were reviewed by me and considered in my medical decision making (see chart for details).    MDM Rules/Calculators/A&P                      Patient presents today with fatigue, fevers, malaise.  She states that she has known Covid exposure from her husband.  She denies any cough, shortness of breath, chest pain, palpitations, lower extremity swelling, dyspnea on exertion or other dyspnea.  She states she otherwise feels asymptomatic apart from her above-stated symptoms.  Her exam is unremarkable apart from faint crackles bilaterally.  As patient's oxygen saturation was within normal limits at 94% and improved to 96/97% with ambulation I suspect that this is probably atelectasis.  I recommended hydration, rest, Zofran for any nausea and do encourage patient to eat, recommended vitamin C, vitamin D, zinc, gentle exercise.  I gave her return precautions.  She will these medications and rest.  She will return to ED if she has any new or concerning symptoms.  Specifically doubt that patient has pulmonary embolism.  She is PERC negative.  Doubt pneumonia as she has no cough, is currently afebrile and is not short of breath.  She has no symptoms of localized infection to any particular body area.  She has no lung symptoms, abdominal symptoms, cellulitic regions or urinary symptoms.  Shared decision-making conversation with patient.  She is comfortable discharge today with plan to return to ED if any new or concerning symptoms.  Patient received a PCR test that will result in 6 to 24 hours.  She will monitor her symptoms and refrain from contacting anyone until that time.  She did have a negative test on Tuesday, 1 day after her symptoms began but she is uncertain what She had.    Lynnmarie Razzano was evaluated in Emergency Department on 12/15/2019 for the symptoms described in the history of present illness. She was evaluated in the context of the global COVID-19 pandemic, which necessitated consideration that the patient might be at risk for infection with the SARS-CoV-2 virus that causes COVID-19. Institutional protocols and algorithms that pertain to the evaluation of patients at risk for COVID-19 are in a state of rapid change based on information released by regulatory bodies including the CDC and federal and state organizations. These policies and algorithms were followed during the patient's care in the ED.   Final Clinical Impression(s) / ED Diagnoses Final diagnoses:  Suspected COVID-19 virus infection  Viral syndrome    Rx / DC Orders ED Discharge Orders         Ordered    ondansetron (ZOFRAN ODT) 4 MG disintegrating tablet  Every 8 hours PRN     12/15/19 0931           Tedd Sias, PA 12/15/19 [place], Dan, DO 12/15/19 1020

## 2019-12-19 ENCOUNTER — Encounter (HOSPITAL_BASED_OUTPATIENT_CLINIC_OR_DEPARTMENT_OTHER): Payer: Self-pay

## 2019-12-19 ENCOUNTER — Emergency Department (HOSPITAL_BASED_OUTPATIENT_CLINIC_OR_DEPARTMENT_OTHER): Payer: BC Managed Care – PPO

## 2019-12-19 ENCOUNTER — Other Ambulatory Visit: Payer: Self-pay

## 2019-12-19 ENCOUNTER — Inpatient Hospital Stay (HOSPITAL_BASED_OUTPATIENT_CLINIC_OR_DEPARTMENT_OTHER)
Admission: EM | Admit: 2019-12-19 | Discharge: 2019-12-23 | DRG: 177 | Disposition: A | Discharge status: Home / Self Care | Payer: BC Managed Care – PPO | Attending: Internal Medicine | Admitting: Internal Medicine

## 2019-12-19 DIAGNOSIS — J1282 Pneumonia due to coronavirus disease 2019: Secondary | ICD-10-CM | POA: Diagnosis present

## 2019-12-19 DIAGNOSIS — Z87891 Personal history of nicotine dependence: Secondary | ICD-10-CM

## 2019-12-19 DIAGNOSIS — E860 Dehydration: Secondary | ICD-10-CM | POA: Diagnosis present

## 2019-12-19 DIAGNOSIS — Z91048 Other nonmedicinal substance allergy status: Secondary | ICD-10-CM

## 2019-12-19 DIAGNOSIS — Z885 Allergy status to narcotic agent status: Secondary | ICD-10-CM

## 2019-12-19 DIAGNOSIS — D696 Thrombocytopenia, unspecified: Secondary | ICD-10-CM | POA: Diagnosis present

## 2019-12-19 DIAGNOSIS — G4700 Insomnia, unspecified: Secondary | ICD-10-CM | POA: Diagnosis present

## 2019-12-19 DIAGNOSIS — I959 Hypotension, unspecified: Secondary | ICD-10-CM | POA: Diagnosis present

## 2019-12-19 DIAGNOSIS — Z882 Allergy status to sulfonamides status: Secondary | ICD-10-CM

## 2019-12-19 DIAGNOSIS — R7989 Other specified abnormal findings of blood chemistry: Secondary | ICD-10-CM

## 2019-12-19 DIAGNOSIS — J9600 Acute respiratory failure, unspecified whether with hypoxia or hypercapnia: Secondary | ICD-10-CM | POA: Diagnosis present

## 2019-12-19 DIAGNOSIS — Z79899 Other long term (current) drug therapy: Secondary | ICD-10-CM

## 2019-12-19 DIAGNOSIS — U071 COVID-19: Principal | ICD-10-CM | POA: Diagnosis present

## 2019-12-19 DIAGNOSIS — R112 Nausea with vomiting, unspecified: Secondary | ICD-10-CM | POA: Diagnosis not present

## 2019-12-19 DIAGNOSIS — K219 Gastro-esophageal reflux disease without esophagitis: Secondary | ICD-10-CM | POA: Diagnosis not present

## 2019-12-19 DIAGNOSIS — F419 Anxiety disorder, unspecified: Secondary | ICD-10-CM | POA: Diagnosis present

## 2019-12-19 DIAGNOSIS — M6282 Rhabdomyolysis: Secondary | ICD-10-CM | POA: Diagnosis present

## 2019-12-19 DIAGNOSIS — J9601 Acute respiratory failure with hypoxia: Secondary | ICD-10-CM | POA: Diagnosis not present

## 2019-12-19 LAB — COMPREHENSIVE METABOLIC PANEL
ALT: 216 U/L — ABNORMAL HIGH (ref 0–44)
AST: 340 U/L — ABNORMAL HIGH (ref 15–41)
Albumin: 3.3 g/dL — ABNORMAL LOW (ref 3.5–5.0)
Alkaline Phosphatase: 38 U/L (ref 38–126)
Anion gap: 10 (ref 5–15)
BUN: 10 mg/dL (ref 6–20)
CO2: 27 mmol/L (ref 22–32)
Calcium: 8 mg/dL — ABNORMAL LOW (ref 8.9–10.3)
Chloride: 96 mmol/L — ABNORMAL LOW (ref 98–111)
Creatinine, Ser: 1 mg/dL (ref 0.44–1.00)
GFR calc Af Amer: >60 mL/min (ref >60)
GFR calc non Af Amer: >60 mL/min (ref >60)
Glucose, Bld: 100 mg/dL — ABNORMAL HIGH (ref 70–99)
Potassium: 3.3 mmol/L — ABNORMAL LOW (ref 3.5–5.1)
Sodium: 133 mmol/L — ABNORMAL LOW (ref 135–145)
Total Bilirubin: 0.4 mg/dL (ref 0.3–1.2)
Total Protein: 6.3 g/dL — ABNORMAL LOW (ref 6.5–8.1)

## 2019-12-19 LAB — CBC WITH DIFFERENTIAL/PLATELET
Abs Immature Granulocytes: 0.01 10*3/uL (ref 0.00–0.07)
Basophils Absolute: 0 10*3/uL (ref 0.0–0.1)
Basophils Relative: 0 %
Eosinophils Absolute: 0 10*3/uL (ref 0.0–0.5)
Eosinophils Relative: 0 %
HCT: 45.3 % (ref 36.0–46.0)
Hemoglobin: 15.1 g/dL — ABNORMAL HIGH (ref 12.0–15.0)
Immature Granulocytes: 0 %
Lymphocytes Relative: 31 %
Lymphs Abs: 1 10*3/uL (ref 0.7–4.0)
MCH: 30.6 pg (ref 26.0–34.0)
MCHC: 33.3 g/dL (ref 30.0–36.0)
MCV: 91.9 fL (ref 80.0–100.0)
Monocytes Absolute: 0.2 10*3/uL (ref 0.1–1.0)
Monocytes Relative: 6 %
Neutro Abs: 2.1 10*3/uL (ref 1.7–7.7)
Neutrophils Relative %: 63 %
Platelets: 97 10*3/uL — ABNORMAL LOW (ref 150–400)
RBC: 4.93 MIL/uL (ref 3.87–5.11)
RDW: 13.9 % (ref 11.5–15.5)
Smear Review: DECREASED
WBC: 3.3 10*3/uL — ABNORMAL LOW (ref 4.0–10.5)
nRBC: 0 % (ref 0.0–0.2)

## 2019-12-19 LAB — FERRITIN: Ferritin: 1354 ng/mL — ABNORMAL HIGH (ref 11–307)

## 2019-12-19 LAB — LACTATE DEHYDROGENASE: LDH: 503 U/L — ABNORMAL HIGH (ref 98–192)

## 2019-12-19 LAB — LACTIC ACID, PLASMA: Lactic Acid, Venous: 0.9 mmol/L (ref 0.5–1.9)

## 2019-12-19 LAB — HCG, SERUM, QUALITATIVE: Preg, Serum: NEGATIVE

## 2019-12-19 LAB — GLUCOSE, CAPILLARY: Glucose-Capillary: 122 mg/dL — ABNORMAL HIGH (ref 70–99)

## 2019-12-19 LAB — PROCALCITONIN: Procalcitonin: 0.16 ng/mL

## 2019-12-19 LAB — MAGNESIUM: Magnesium: 2 mg/dL (ref 1.7–2.4)

## 2019-12-19 LAB — FIBRINOGEN: Fibrinogen: 404 mg/dL (ref 210–475)

## 2019-12-19 LAB — CK: Total CK: 965 U/L — ABNORMAL HIGH (ref 38–234)

## 2019-12-19 LAB — TROPONIN I (HIGH SENSITIVITY): Troponin I (High Sensitivity): 18 ng/L — ABNORMAL HIGH (ref <18)

## 2019-12-19 LAB — D-DIMER, QUANTITATIVE: D-Dimer, Quant: 0.62 ug/mL-FEU — ABNORMAL HIGH (ref 0.00–0.50)

## 2019-12-19 LAB — C-REACTIVE PROTEIN: CRP: 2 mg/dL — ABNORMAL HIGH (ref <1.0)

## 2019-12-19 LAB — TRIGLYCERIDES: Triglycerides: 212 mg/dL — ABNORMAL HIGH (ref <150)

## 2019-12-19 MED ORDER — DEXAMETHASONE SODIUM PHOSPHATE 10 MG/ML IJ SOLN
6.0000 mg | INTRAMUSCULAR | Status: DC
  Administered 2019-12-20 – 2019-12-23 (×4): 6 mg via INTRAVENOUS
  Filled 2019-12-19 (×4): qty 1

## 2019-12-19 MED ORDER — SODIUM CHLORIDE 0.9% FLUSH
3.0000 mL | Freq: Two times a day (BID) | INTRAVENOUS | Status: DC
  Administered 2019-12-19 – 2019-12-23 (×7): 3 mL via INTRAVENOUS

## 2019-12-19 MED ORDER — DEXAMETHASONE SODIUM PHOSPHATE 10 MG/ML IJ SOLN
6.0000 mg | Freq: Once | INTRAMUSCULAR | Status: AC
  Administered 2019-12-19: 6 mg via INTRAVENOUS
  Filled 2019-12-19: qty 1

## 2019-12-19 MED ORDER — ALPRAZOLAM 0.25 MG PO TABS
0.2500 mg | ORAL_TABLET | Freq: Every evening | ORAL | Status: DC | PRN
  Administered 2019-12-20: 0.25 mg via ORAL
  Filled 2019-12-19: qty 1

## 2019-12-19 MED ORDER — SODIUM CHLORIDE 0.9 % IV SOLN
100.0000 mg | Freq: Once | INTRAVENOUS | Status: AC
  Administered 2019-12-19: 100 mg via INTRAVENOUS
  Filled 2019-12-19: qty 20

## 2019-12-19 MED ORDER — ONDANSETRON HCL 4 MG PO TABS: 4.0000 mg | ORAL_TABLET | Freq: Four times a day (QID) | ORAL | Status: DC | PRN

## 2019-12-19 MED ORDER — POTASSIUM CHLORIDE 10 MEQ/100ML IV SOLN
10.0000 meq | INTRAVENOUS | Status: AC
  Administered 2019-12-19 (×2): 10 meq via INTRAVENOUS
  Filled 2019-12-19: qty 100

## 2019-12-19 MED ORDER — ONDANSETRON HCL 4 MG/2ML IJ SOLN
4.0000 mg | Freq: Once | INTRAMUSCULAR | Status: AC
  Administered 2019-12-19: 4 mg via INTRAVENOUS
  Filled 2019-12-19: qty 2

## 2019-12-19 MED ORDER — ENOXAPARIN SODIUM 40 MG/0.4ML ~~LOC~~ SOLN: 40.0000 mg | SUBCUTANEOUS | Status: DC

## 2019-12-19 MED ORDER — SODIUM CHLORIDE 0.9 % IV SOLN: INTRAVENOUS | Status: DC

## 2019-12-19 MED ORDER — HYDROCOD POLST-CPM POLST ER 10-8 MG/5ML PO SUER
5.0000 mL | Freq: Once | ORAL | Status: AC
  Administered 2019-12-19: 5 mL via ORAL
  Filled 2019-12-19: qty 5

## 2019-12-19 MED ORDER — ACETAMINOPHEN 500 MG PO TABS
1000.0000 mg | ORAL_TABLET | Freq: Once | ORAL | Status: AC
  Administered 2019-12-19: 1000 mg via ORAL
  Filled 2019-12-19: qty 2

## 2019-12-19 MED ORDER — GUAIFENESIN-DM 100-10 MG/5ML PO SYRP
10.0000 mL | ORAL_SOLUTION | ORAL | Status: DC | PRN
  Administered 2019-12-19 – 2019-12-22 (×4): 10 mL via ORAL
  Filled 2019-12-19 (×4): qty 10

## 2019-12-19 MED ORDER — ACETAMINOPHEN 325 MG PO TABS
650.0000 mg | ORAL_TABLET | Freq: Four times a day (QID) | ORAL | Status: DC | PRN
  Administered 2019-12-21: 650 mg via ORAL
  Filled 2019-12-19: qty 2

## 2019-12-19 MED ORDER — PANTOPRAZOLE SODIUM 40 MG PO TBEC
40.0000 mg | DELAYED_RELEASE_TABLET | Freq: Every day | ORAL | Status: DC
  Administered 2019-12-19 – 2019-12-23 (×5): 40 mg via ORAL
  Filled 2019-12-19 (×5): qty 1

## 2019-12-19 MED ORDER — SODIUM CHLORIDE 0.9 % IV SOLN
100.0000 mg | Freq: Every day | INTRAVENOUS | Status: AC
  Administered 2019-12-20 – 2019-12-23 (×4): 100 mg via INTRAVENOUS
  Filled 2019-12-19 (×4): qty 20

## 2019-12-19 MED ORDER — ONDANSETRON HCL 4 MG/2ML IJ SOLN: 4.0000 mg | Freq: Four times a day (QID) | INTRAMUSCULAR | Status: DC | PRN

## 2019-12-19 MED ORDER — SODIUM CHLORIDE 0.9 % IV BOLUS
1000.0000 mL | Freq: Once | INTRAVENOUS | Status: AC
  Administered 2019-12-19: 1000 mL via INTRAVENOUS

## 2019-12-19 MED ORDER — MELATONIN 3 MG PO TABS
3.0000 mg | ORAL_TABLET | Freq: Every day | ORAL | Status: DC
  Administered 2019-12-19 – 2019-12-22 (×4): 3 mg via ORAL
  Filled 2019-12-19 (×4): qty 1

## 2019-12-19 MED ORDER — KETOROLAC TROMETHAMINE 15 MG/ML IJ SOLN
15.0000 mg | Freq: Once | INTRAMUSCULAR | Status: AC
  Administered 2019-12-19: 15 mg via INTRAVENOUS
  Filled 2019-12-19: qty 1

## 2019-12-19 MED ORDER — HYDROCODONE-ACETAMINOPHEN 5-325 MG PO TABS
1.0000 | ORAL_TABLET | ORAL | Status: DC | PRN
  Administered 2019-12-19: 2 via ORAL
  Filled 2019-12-19: qty 2

## 2019-12-19 NOTE — ED Provider Notes (Signed)
[facility] EMERGENCY DEPARTMENT Provider Note   CSN: [id number] Arrival date & time: 12/19/19  1549     History Chief Complaint  Patient presents with  . Fever    (+ Covid)    Alexandra Coffey is a 54 y.o. female.  54 yo F with a chief complaints of fatigue.  Patient states that she was diagnosed with the coronavirus over the weekend but has been sick for about 9 days now.  Her predominant symptoms are nausea and vomiting.  She has been having a lot of trouble keeping anything down including liquids.  Becoming very fatigued when she gets up to move around the house.  She denies any chest pain or pressure denies abdominal pain denies diarrhea.  Has had a pretty persistent fever.  Denies myalgias.  The history is provided by the patient.  Fever Associated symptoms: cough, nausea and vomiting   Associated symptoms: no chest pain, no chills, no congestion, no diarrhea, no dysuria, no headaches, no myalgias and no rhinorrhea   Illness Severity:  Moderate Onset quality:  Gradual Duration:  9 days Timing:  Constant Progression:  Worsening Chronicity:  New Associated symptoms: cough, fever, nausea and vomiting   Associated symptoms: no abdominal pain, no chest pain, no congestion, no diarrhea, no headaches, no myalgias, no rhinorrhea, no shortness of breath and no wheezing        Past Medical History:  Diagnosis Date  . Allergy    SEASONAL  . Broken bones    ankles  . GERD (gastroesophageal reflux disease)   . 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  . 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 Shingles     Patient Active Problem List   Diagnosis Date Noted  . Pneumonia due to COVID-19 virus 12/19/2019  . Vaginal odor 12/13/2016  . Vaginitis and vulvovaginitis 12/13/2016  . PMB (postmenopausal bleeding) 08/04/2016  . Esophageal reflux 07/08/2015  . Menopause 05/18/2012  . Weight gain 03/08/2012  . Family history of diabetes mellitus 03/08/2012      Past Surgical History:  Procedure Laterality Date  . ABDOMINOPLASTY    . AUGMENTATION MAMMAPLASTY  2003   SALINE  . LAPAROSCOPIC CHOLECYSTECTOMY  1995  . ORIF ANKLE FRACTURE  2005     OB History    Gravida  2   Para  2   Term  2   Preterm      AB      Living  2     SAB      TAB      Ectopic      Multiple      Live Births  2           Family History  Problem Relation Age of Onset  . Diabetes Mother   . Osteoporosis Mother   . Hyperlipidemia Mother   . Dementia Mother   . Cancer Father        esophageal   . Colon cancer Neg Hx     Social History   Tobacco Use  . Smoking status: Former Smoker    Quit date: 03/09/2007    Years since quitting: 12.7  . Smokeless tobacco: Never Used  Substance Use Topics  . Alcohol use: No    Alcohol/week: 0.0 standard drinks  . Drug use: No    Home Medications Prior to Admission medications   Medication Sig Start Date End Date Taking? Authorizing Provider  ALPRAZolam (  XANAX) 0.5 MG tablet Take 0.5 mg by mouth at bedtime as needed.      [provider]  omeprazole (PRILOSEC) 20 MG capsule TAKE 1 CAPSULE BY MOUTH EVERY DAY 07/17/18   Princess Bruins, MD  ondansetron (ZOFRAN ODT) 4 MG disintegrating tablet Take 1 tablet (4 mg total) by mouth every 8 (eight) hours as needed for nausea or vomiting. 12/15/19   Tedd Sias, PA  UNABLE TO FIND Hormone replacement-    [provider]    Allergies    Codeine and Sulfa antibiotics  Review of Systems   Review of Systems  Constitutional: Positive for fever. Negative for chills.  HENT: Negative for congestion and rhinorrhea.   Eyes: Negative for redness and visual disturbance.  Respiratory: Positive for cough. Negative for shortness of breath and wheezing.   Cardiovascular: Negative for chest pain and palpitations.  Gastrointestinal: Positive for nausea and vomiting. Negative for abdominal pain and diarrhea.  Genitourinary: Negative for  dysuria and urgency.  Musculoskeletal: Negative for arthralgias and myalgias.  Skin: Negative for pallor and wound.  Neurological: Negative for dizziness and headaches.    Physical Exam Updated Vital Signs BP (!) 99/56   Pulse 72   Temp 99.7 F (37.6 C) (Oral)   Resp (!) 25   LMP 03/11/2014   SpO2 90%   Physical Exam Vitals and nursing note reviewed.  Constitutional:      General: She is not in acute distress.    Appearance: She is well-developed. She is not diaphoretic.  HENT:     Head: Normocephalic and atraumatic.  Eyes:     Pupils: Pupils are equal, round, and reactive to light.  Cardiovascular:     Rate and Rhythm: Normal rate and regular rhythm.     Heart sounds: No murmur. No friction rub. No gallop.   Pulmonary:     Effort: Pulmonary effort is normal.     Breath sounds: No wheezing or rales.  Abdominal:     General: There is no distension.     Palpations: Abdomen is soft.     Tenderness: There is no abdominal tenderness.  Musculoskeletal:        General: No tenderness.     Cervical back: Normal range of motion and neck supple.  Skin:    General: Skin is warm and dry.  Neurological:     Mental Status: She is alert and oriented to person, place, and time.  Psychiatric:        Behavior: Behavior normal.     ED Results / Procedures / Treatments   Labs (all labs ordered are listed, but only abnormal results are displayed) Labs Reviewed  CBC WITH DIFFERENTIAL/PLATELET - Abnormal; Notable for the following components:      Result Value   WBC 3.3 (*)    Hemoglobin 15.1 (*)    Platelets 97 (*)    All other components within normal limits  COMPREHENSIVE METABOLIC PANEL - Abnormal; Notable for the following components:   Sodium 133 (*)    Potassium 3.3 (*)    Chloride 96 (*)    Glucose, Bld 100 (*)    Calcium 8.0 (*)    Total Protein 6.3 (*)    Albumin 3.3 (*)    AST 340 (*)    ALT 216 (*)    All other components within normal limits  D-DIMER,  QUANTITATIVE (NOT AT [facility]) - Abnormal; Notable for the following components:   D-Dimer, Quant 0.62 (*)    All  other components within normal limits  LACTATE DEHYDROGENASE - Abnormal; Notable for the following components:   LDH 503 (*)    All other components within normal limits  FERRITIN - Abnormal; Notable for the following components:   Ferritin 1,354 (*)    All other components within normal limits  TRIGLYCERIDES - Abnormal; Notable for the following components:   Triglycerides 212 (*)    All other components within normal limits  C-REACTIVE PROTEIN - Abnormal; Notable for the following components:   CRP 2.0 (*)    All other components within normal limits  CULTURE, BLOOD (ROUTINE X 2)  CULTURE, BLOOD (ROUTINE X 2)  LACTIC ACID, PLASMA  PROCALCITONIN  FIBRINOGEN  HCG, SERUM, QUALITATIVE  LACTIC ACID, PLASMA  HEPATITIS PANEL, ACUTE    EKG EKG Interpretation  Date/Time:  Wednesday December 19 2019 16:06:05 EDT Ventricular Rate:  75 PR Interval:  170 QRS Duration: 70 QT Interval:  392 QTC Calculation: 437 R Axis:   40 Text Interpretation: Normal sinus rhythm Possible Left atrial enlargement Low voltage QRS Borderline ECG No old tracing to compare Confirmed by Deno Etienne [phone]) on 12/19/2019 4:19:31 PM   Radiology DG Chest Port 1 View  Result Date: 12/19/2019 CLINICAL DATA:  Shortness of breath, recent COVID diagnosis EXAM: PORTABLE CHEST 1 VIEW COMPARISON:  None. FINDINGS: Low lung volumes. Mild interstitial prominence with superimposed patchy density. No significant pleural effusion. No pneumothorax. Heart size is normal for technique. Calcified plaque is present along the aortic arch. IMPRESSION: Mild interstitial prominence with superimposed patchy density may reflect atypical pneumonia including COVID-19 or edema. Electronically Signed   By: Macy Mis M.D.   On: 12/19/2019 17:08    Procedures Procedures (including critical care time)  Medications Ordered in  ED Medications  remdesivir 100 mg in sodium chloride 0.9 % 100 mL IVPB (has no administration in time range)  acetaminophen (TYLENOL) tablet 1,000 mg (1,000 mg Oral Given 12/19/19 1632)  ketorolac (TORADOL) 15 MG/ML injection 15 mg (15 mg Intravenous Given 12/19/19 1629)  chlorpheniramine-HYDROcodone (TUSSIONEX) 10-8 MG/5ML suspension 5 mL (5 mLs Oral Given 12/19/19 1632)  ondansetron (ZOFRAN) injection 4 mg (4 mg Intravenous Given 12/19/19 1628)  dexamethasone (DECADRON) injection 6 mg (6 mg Intravenous Given 12/19/19 1719)  remdesivir 100 mg in sodium chloride 0.9 % 100 mL IVPB (0 mg Intravenous Stopped 12/19/19 1814)    Followed by  remdesivir 100 mg in sodium chloride 0.9 % 100 mL IVPB (0 mg Intravenous Stopped 12/19/19 1841)  sodium chloride 0.9 % bolus 1,000 mL (0 mLs Intravenous Stopped 12/19/19 1853)    ED Course  I have reviewed the triage vital signs and the nursing notes.  Pertinent labs & imaging results that were available during my care of the patient were reviewed by me and considered in my medical decision making (see chart for details).    MDM Rules/Calculators/A&P                      54 yo F with a chief complaints of having the novel coronavirus.  Patient states that she is gotten to the point where she has a lot of trouble getting around the house and has not been eating and drinking for the past week or so.  She was seen here over the weekend and was given an antiemetic however has not been able to had much improvement with it at home.  Her initial blood pressure here was 80/50.  Improved with IV fluids.  87%  on room air, suspect much worse on exertion.  Will obtain a laboratory evaluation and discuss with hospitalist for admission.  CRITICAL CARE Performed by: Cecilio Asper   Total critical care time: 35 minutes  Critical care time was exclusive of separately billable procedures and treating other patients.  Critical care was necessary to treat or prevent  imminent or life-threatening deterioration.  Critical care was time spent personally by me on the following activities: development of treatment plan with patient and/or surrogate as well as nursing, discussions with consultants, evaluation of patient's response to treatment, examination of patient, obtaining history from patient or surrogate, ordering and performing treatments and interventions, ordering and review of laboratory studies, ordering and review of radiographic studies, pulse oximetry and re-evaluation of patient's condition.  The patients results and plan were reviewed and discussed.   Any x-rays performed were independently reviewed by myself.   Differential diagnosis were considered with the presenting HPI.  Medications  remdesivir 100 mg in sodium chloride 0.9 % 100 mL IVPB (has no administration in time range)  acetaminophen (TYLENOL) tablet 1,000 mg (1,000 mg Oral Given 12/19/19 1632)  ketorolac (TORADOL) 15 MG/ML injection 15 mg (15 mg Intravenous Given 12/19/19 1629)  chlorpheniramine-HYDROcodone (TUSSIONEX) 10-8 MG/5ML suspension 5 mL (5 mLs Oral Given 12/19/19 1632)  ondansetron (ZOFRAN) injection 4 mg (4 mg Intravenous Given 12/19/19 1628)  dexamethasone (DECADRON) injection 6 mg (6 mg Intravenous Given 12/19/19 1719)  remdesivir 100 mg in sodium chloride 0.9 % 100 mL IVPB (0 mg Intravenous Stopped 12/19/19 1814)    Followed by  remdesivir 100 mg in sodium chloride 0.9 % 100 mL IVPB (0 mg Intravenous Stopped 12/19/19 1841)  sodium chloride 0.9 % bolus 1,000 mL (0 mLs Intravenous Stopped 12/19/19 1853)    Vitals:   12/19/19 1715 12/19/19 1730 12/19/19 1800 12/19/19 1830  BP: (!) 95/52 (!) 100/55 (!) 94/53 (!) 99/56  Pulse: 71 71 70 72  Resp: (!) 21 (!) 25  (!) 25  Temp:      TempSrc:      SpO2: 93% 91% 90% 90%    Final diagnoses:  Acute respiratory failure with hypoxia ([place])    Admission/ observation were discussed with the admitting physician, patient and/or family  and they are comfortable with the plan.    Final Clinical Impression(s) / ED Diagnoses Final diagnoses:  Acute respiratory failure with hypoxia [facility])    Rx / DC Orders ED Discharge Orders    None       Deno Etienne, DO 12/19/19 1902

## 2019-12-19 NOTE — ED Triage Notes (Addendum)
Pt states she was seen here dx with covid 4/10-cont'd fever "feels worse"-pt to triage in w/c-assisted from w/c to stretcher in [place] room-RT at [facility]

## 2019-12-19 NOTE — ED Notes (Signed)
Husband updated on rm & phone number on floor.   Purse with cellphone placed in pt belonging bag and transported with pt.

## 2019-12-20 DIAGNOSIS — F419 Anxiety disorder, unspecified: Secondary | ICD-10-CM | POA: Diagnosis present

## 2019-12-20 DIAGNOSIS — J9600 Acute respiratory failure, unspecified whether with hypoxia or hypercapnia: Secondary | ICD-10-CM | POA: Diagnosis present

## 2019-12-20 DIAGNOSIS — D696 Thrombocytopenia, unspecified: Secondary | ICD-10-CM | POA: Diagnosis present

## 2019-12-20 DIAGNOSIS — J9601 Acute respiratory failure with hypoxia: Secondary | ICD-10-CM

## 2019-12-20 DIAGNOSIS — K219 Gastro-esophageal reflux disease without esophagitis: Secondary | ICD-10-CM | POA: Diagnosis present

## 2019-12-20 DIAGNOSIS — Z87891 Personal history of nicotine dependence: Secondary | ICD-10-CM | POA: Diagnosis not present

## 2019-12-20 DIAGNOSIS — J1282 Pneumonia due to coronavirus disease 2019: Secondary | ICD-10-CM

## 2019-12-20 DIAGNOSIS — M6282 Rhabdomyolysis: Secondary | ICD-10-CM | POA: Diagnosis present

## 2019-12-20 DIAGNOSIS — I959 Hypotension, unspecified: Secondary | ICD-10-CM | POA: Diagnosis present

## 2019-12-20 DIAGNOSIS — G4700 Insomnia, unspecified: Secondary | ICD-10-CM | POA: Diagnosis present

## 2019-12-20 DIAGNOSIS — U071 COVID-19: Principal | ICD-10-CM

## 2019-12-20 DIAGNOSIS — Z882 Allergy status to sulfonamides status: Secondary | ICD-10-CM | POA: Diagnosis not present

## 2019-12-20 DIAGNOSIS — Z885 Allergy status to narcotic agent status: Secondary | ICD-10-CM | POA: Diagnosis not present

## 2019-12-20 DIAGNOSIS — E860 Dehydration: Secondary | ICD-10-CM | POA: Diagnosis present

## 2019-12-20 DIAGNOSIS — Z91048 Other nonmedicinal substance allergy status: Secondary | ICD-10-CM | POA: Diagnosis not present

## 2019-12-20 DIAGNOSIS — Z79899 Other long term (current) drug therapy: Secondary | ICD-10-CM | POA: Diagnosis not present

## 2019-12-20 LAB — CBC WITH DIFFERENTIAL/PLATELET
Abs Immature Granulocytes: 0.01 10*3/uL (ref 0.00–0.07)
Basophils Absolute: 0 10*3/uL (ref 0.0–0.1)
Basophils Relative: 0 %
Eosinophils Absolute: 0 10*3/uL (ref 0.0–0.5)
Eosinophils Relative: 0 %
HCT: 43.5 % (ref 36.0–46.0)
Hemoglobin: 14 g/dL (ref 12.0–15.0)
Immature Granulocytes: 1 %
Lymphocytes Relative: 35 %
Lymphs Abs: 0.7 10*3/uL (ref 0.7–4.0)
MCH: 30.2 pg (ref 26.0–34.0)
MCHC: 32.2 g/dL (ref 30.0–36.0)
MCV: 94 fL (ref 80.0–100.0)
Monocytes Absolute: 0.1 10*3/uL (ref 0.1–1.0)
Monocytes Relative: 4 %
Neutro Abs: 1.1 10*3/uL — ABNORMAL LOW (ref 1.7–7.7)
Neutrophils Relative %: 60 %
Platelets: 87 10*3/uL — ABNORMAL LOW (ref 150–400)
RBC: 4.63 MIL/uL (ref 3.87–5.11)
RDW: 14.1 % (ref 11.5–15.5)
WBC: 1.9 10*3/uL — ABNORMAL LOW (ref 4.0–10.5)
nRBC: 0 % (ref 0.0–0.2)

## 2019-12-20 LAB — HEPATITIS PANEL, ACUTE
HCV Ab: NONREACTIVE
Hep A IgM: NONREACTIVE
Hep B C IgM: NONREACTIVE
Hepatitis B Surface Ag: NONREACTIVE

## 2019-12-20 LAB — COMPREHENSIVE METABOLIC PANEL
ALT: 191 U/L — ABNORMAL HIGH (ref 0–44)
AST: 267 U/L — ABNORMAL HIGH (ref 15–41)
Albumin: 2.8 g/dL — ABNORMAL LOW (ref 3.5–5.0)
Alkaline Phosphatase: 34 U/L — ABNORMAL LOW (ref 38–126)
Anion gap: 9 (ref 5–15)
BUN: 11 mg/dL (ref 6–20)
CO2: 22 mmol/L (ref 22–32)
Calcium: 7.3 mg/dL — ABNORMAL LOW (ref 8.9–10.3)
Chloride: 105 mmol/L (ref 98–111)
Creatinine, Ser: 0.71 mg/dL (ref 0.44–1.00)
GFR calc Af Amer: >60 mL/min (ref >60)
GFR calc non Af Amer: >60 mL/min (ref >60)
Glucose, Bld: 126 mg/dL — ABNORMAL HIGH (ref 70–99)
Potassium: 4.4 mmol/L (ref 3.5–5.1)
Sodium: 136 mmol/L (ref 135–145)
Total Bilirubin: 0.2 mg/dL — ABNORMAL LOW (ref 0.3–1.2)
Total Protein: 5.4 g/dL — ABNORMAL LOW (ref 6.5–8.1)

## 2019-12-20 LAB — URINALYSIS, ROUTINE W REFLEX MICROSCOPIC
Bilirubin Urine: NEGATIVE
Glucose, UA: NEGATIVE mg/dL
Hgb urine dipstick: NEGATIVE
Ketones, ur: 5 mg/dL — AB
Leukocytes,Ua: NEGATIVE
Nitrite: NEGATIVE
Protein, ur: 100 mg/dL — AB
Specific Gravity, Urine: 1.028 (ref 1.005–1.030)
pH: 5 (ref 5.0–8.0)

## 2019-12-20 LAB — PHOSPHORUS: Phosphorus: 3.6 mg/dL (ref 2.5–4.6)

## 2019-12-20 LAB — HIV ANTIBODY (ROUTINE TESTING W REFLEX): HIV Screen 4th Generation wRfx: NONREACTIVE

## 2019-12-20 LAB — LIPID PANEL
Cholesterol: 126 mg/dL (ref 0–200)
HDL: 26 mg/dL — ABNORMAL LOW (ref >40)
LDL Cholesterol: 73 mg/dL (ref 0–99)
Total CHOL/HDL Ratio: 4.8 RATIO
Triglycerides: 134 mg/dL (ref <150)
VLDL: 27 mg/dL (ref 0–40)

## 2019-12-20 LAB — C-REACTIVE PROTEIN: CRP: 1.7 mg/dL — ABNORMAL HIGH (ref <1.0)

## 2019-12-20 LAB — SODIUM, URINE, RANDOM: Sodium, Ur: 44 mmol/L

## 2019-12-20 LAB — MAGNESIUM: Magnesium: 2.2 mg/dL (ref 1.7–2.4)

## 2019-12-20 LAB — FERRITIN: Ferritin: 1344 ng/mL — ABNORMAL HIGH (ref 11–307)

## 2019-12-20 LAB — TROPONIN I (HIGH SENSITIVITY): Troponin I (High Sensitivity): 18 ng/L — ABNORMAL HIGH (ref <18)

## 2019-12-20 LAB — CREATININE, URINE, RANDOM: Creatinine, Urine: 207.53 mg/dL

## 2019-12-20 LAB — CK: Total CK: 818 U/L — ABNORMAL HIGH (ref 38–234)

## 2019-12-20 LAB — D-DIMER, QUANTITATIVE: D-Dimer, Quant: 0.41 ug/mL-FEU (ref 0.00–0.50)

## 2019-12-20 MED ORDER — ALPRAZOLAM 1 MG PO TABS
1.0000 mg | ORAL_TABLET | Freq: Once | ORAL | Status: AC
  Administered 2019-12-20: 1 mg via ORAL
  Filled 2019-12-20: qty 1

## 2019-12-20 MED ORDER — SODIUM CHLORIDE 0.9 % IV BOLUS
250.0000 mL | Freq: Once | INTRAVENOUS | Status: AC
  Administered 2019-12-20: 250 mL via INTRAVENOUS

## 2019-12-20 MED ORDER — ALBUTEROL SULFATE HFA 108 (90 BASE) MCG/ACT IN AERS
1.0000 | INHALATION_SPRAY | Freq: Four times a day (QID) | RESPIRATORY_TRACT | Status: DC
  Administered 2019-12-20 – 2019-12-23 (×12): 1 via RESPIRATORY_TRACT
  Filled 2019-12-20 (×2): qty 6.7

## 2019-12-20 MED ORDER — LORAZEPAM 2 MG/ML IJ SOLN
1.0000 mg | Freq: Two times a day (BID) | INTRAMUSCULAR | Status: DC | PRN
  Administered 2019-12-20: 1 mg via INTRAVENOUS
  Filled 2019-12-20: qty 1

## 2019-12-20 MED ORDER — NORTRIPTYLINE HCL 25 MG PO CAPS: 25.0000 mg | ORAL_CAPSULE | Freq: Every day | ORAL | Status: DC

## 2019-12-20 NOTE — H&P (Signed)
Alexandra Coffey [id number] DOB: 03/11/1966 DOA: 12/19/2019  PCP: [name], MD    Outpatient Specialists:   NONE   Patient arrived to ER on 12/19/19 at 1549  Patient coming from: home Lives  With family   Chief Complaint:   Chief Complaint  Patient presents with  . Fever    (+ Covid)    HPI: Alexandra Coffey is a 54 y.o. female with medical history significant of recent diagnosis of COVID19, GERD    Presented with 9 day hx  Fatigue, nausea and vomiting denies any chest pain or abdominal pain no diarrhea patient has been counseled fatigue that she has hard time ambulating.  She has had persistent fevers. Reports cough.  Report they had work done on the house and the worker was positive   Infectious risk factors:  Reports fever  dry cough N/V/ SOB,    severe fatigue, fever 102    KNOWN [place]   Has  Not been vaccinated against COVID   In  ER  COVID TEST    POSITIVE,     Lab Results  Component Value Date   [place] (A) 12/15/2019     Regarding pertinent Chronic problems:      GERD on protonix  While in ER: Confirmed to have Covid CXR showing atypical PNA   Hospitalist was called for admission for COVID   The following Work up has been ordered so far:  Orders Placed This Encounter  Procedures  . Blood Culture (routine x 2)  . DG Chest Port 1 View  . Lactic acid, plasma  . CBC WITH DIFFERENTIAL  . Comprehensive metabolic panel  . D-dimer, quantitative  . Procalcitonin  . Lactate dehydrogenase  . Ferritin  . Triglycerides  . Fibrinogen  . C-reactive protein  . hCG, serum, qualitative  . Hepatitis panel, acute  . Diet NPO time specified  . Cardiac monitoring  . Insert peripheral IV x 2  . Initiate Carrier Fluid Protocol  . Place surgical mask on patient  . Patient to wear surgical mask during transportation  . Assess patient for ability to self-prone. If able (can move self in bed, ambulate) and stable (SpO2 and  oxygen requirement):  . RN/NT - Document specific oxygen requirements in CHL  . Notify EDP if new oxygen requirements escalates > 4L per minute [place]  . RN to draw the following extra tubes:  . Consult to hospitalist  ALL PATIENTS BEING ADMITTED/HAVING PROCEDURES NEED COVID-19 SCREENING [place], Covid pneumonia and dehydration  . remdesivir per pharmacy consult  . Airborne and Contact precautions  . Pulse oximetry, continuous  . EKG 12-Lead  . ED EKG 12-Lead  . Place in observation (patient's expected length of stay will be less than 2 midnights)    Following Medications were ordered in ER: Medications  remdesivir 100 mg in sodium chloride 0.9 % 100 mL IVPB (has no administration in time range)  acetaminophen (TYLENOL) tablet 1,000 mg (1,000 mg Oral Given 12/19/19 1632)  ketorolac (TORADOL) 15 MG/ML injection 15 mg (15 mg Intravenous Given 12/19/19 1629)  chlorpheniramine-HYDROcodone (TUSSIONEX) 10-8 MG/5ML suspension 5 mL (5 mLs Oral Given 12/19/19 1632)  ondansetron (ZOFRAN) injection 4 mg (4 mg Intravenous Given 12/19/19 1628)  dexamethasone (DECADRON) injection 6 mg (6 mg Intravenous Given 12/19/19 1719)  remdesivir 100 mg in sodium chloride 0.9 % 100 mL IVPB (0 mg Intravenous Stopped 12/19/19 1814)    Followed by  remdesivir 100 mg in sodium chloride 0.9 % 100  mL IVPB (0 mg Intravenous Stopped 12/19/19 1841)  sodium chloride 0.9 % bolus 1,000 mL (0 mLs Intravenous Stopped 12/19/19 1853)        Consult Orders  (From admission, onward)         Start     Ordered   12/19/19 1703  Consult to hospitalist  ALL PATIENTS BEING ADMITTED/HAVING PROCEDURES NEED COVID-19 SCREENING [place], Covid pneumonia and dehydration- call made via RN  Once    Comments: ALL PATIENTS BEING ADMITTED/HAVING PROCEDURES NEED COVID-19 SCREENING  [place], Covid pneumonia and dehydration  Provider:  (Not yet assigned)  Question Answer Comment  Place call to: [facility]   Reason for Consult Admit      12/19/19  1703          Significant initial  Findings: Abnormal Labs Reviewed  CBC WITH DIFFERENTIAL/PLATELET - Abnormal; Notable for the following components:      Result Value   WBC 3.3 (*)    Hemoglobin 15.1 (*)    Platelets 97 (*)    All other components within normal limits  COMPREHENSIVE METABOLIC PANEL - Abnormal; Notable for the following components:   Sodium 133 (*)    Potassium 3.3 (*)    Chloride 96 (*)    Glucose, Bld 100 (*)    Calcium 8.0 (*)    Total Protein 6.3 (*)    Albumin 3.3 (*)    AST 340 (*)    ALT 216 (*)    All other components within normal limits  D-DIMER, QUANTITATIVE (NOT AT [facility]) - Abnormal; Notable for the following components:   D-Dimer, Quant 0.62 (*)    All other components within normal limits  LACTATE DEHYDROGENASE - Abnormal; Notable for the following components:   LDH 503 (*)    All other components within normal limits  FERRITIN - Abnormal; Notable for the following components:   Ferritin 1,354 (*)    All other components within normal limits  TRIGLYCERIDES - Abnormal; Notable for the following components:   Triglycerides 212 (*)    All other components within normal limits  C-REACTIVE PROTEIN - Abnormal; Notable for the following components:   CRP 2.0 (*)    All other components within normal limits     Otherwise labs showing:    Recent Labs  Lab 12/19/19 1605  NA 133*  K 3.3*  CO2 27  GLUCOSE 100*  BUN 10  CREATININE 1.00  CALCIUM 8.0*   Cr    Up from baseline see below Lab Results  Component Value Date   CREATININE 1.00 12/19/2019   CREATININE 0.84 05/06/2016   CREATININE 0.75 04/08/2014    Recent Labs  Lab 12/19/19 1605  AST 340*  ALT 216*  ALKPHOS 38  BILITOT 0.4  PROT 6.3*  ALBUMIN 3.3*   Lab Results  Component Value Date   CALCIUM 8.0 (L) 12/19/2019     WBC      Component Value Date/Time   WBC 3.3 (L) 12/19/2019 1605   ANC    Component Value Date/Time   NEUTROABS 2.1 12/19/2019 1605   ALC No  components found for: LYMPHAB   Plt: Lab Results  Component Value Date   PLT 97 (L) 12/19/2019     Lactic Acid, Venous    Component Value Date/Time   LATICACIDVEN 0.9 12/19/2019 1605   Procalcitonin 0.16   COVID-19 Labs  Recent Labs    12/19/19 1605  DDIMER 0.62*  FERRITIN 1,354*  LDH 503*  CRP 2.0*  Lab Results  Component Value Date   SARSCOV2NAA POSITIVE (A) 12/15/2019    HG/HCT  stable,      Component Value Date/Time   HGB 15.1 (H) 12/19/2019 1605   HCT 45.3 12/19/2019 1605   Troponin 18 Cardiac Panel (last 3 results) Recent Labs    12/19/19 2148  CKTOTAL 965*       ECG: Ordered Personally reviewed by me showing: HR : 75 Rhythm:  NSR,     no evidence of ischemic changes QTC 437    UA  not ordered       Ordered   CXR - bibasilar infitrates     ED Triage Vitals  Enc Vitals Group     BP 12/19/19 1556 (!) 80/50     Pulse Rate 12/19/19 1556 80     Resp 12/19/19 1556 16     Temp 12/19/19 1556 99.7 F (37.6 C)     Temp Source 12/19/19 1556 Oral     SpO2 12/19/19 1556 (!) 89 %     Weight --      Height --      Head Circumference --      Peak Flow --      Pain Score 12/19/19 1606 10     Pain Loc --      Pain Edu? --      Excl. in [place]? --   TMAX(24)@       Latest  Blood pressure 103/67, pulse 68, temperature 98.7 F (37.1 C), temperature source Oral, resp. rate (!) 22, last menstrual period 03/11/2014, SpO2 91 %.      Review of Systems:    Pertinent positives include:  Fevers, chills, fatigue,, nausea, vomiting,  Constitutional:  No weight loss, night sweats, weight loss  HEENT:  No headaches, Difficulty swallowing,Tooth/dental problems,Sore throat,  No sneezing, itching, ear ache, nasal congestion, post nasal drip,  Cardio-vascular:  No chest pain, Orthopnea, PND, anasarca, dizziness, palpitations.no Bilateral lower extremity swelling  GI:  No heartburn, indigestion, abdominal pain diarrhea, change in bowel habits, loss of  appetite, melena, blood in stool, hematemesis Resp:  no shortness of breath at rest. No dyspnea on exertion, No excess mucus, no productive cough, No non-productive cough, No coughing up of blood.No change in color of mucus.No wheezing. Skin:  no rash or lesions. No jaundice GU:  no dysuria, change in color of urine, no urgency or frequency. No straining to urinate.  No flank pain.  Musculoskeletal:  No joint pain or no joint swelling. No decreased range of motion. No back pain.  Psych:  No change in mood or affect. No depression or anxiety. No memory loss.  Neuro: no localizing neurological complaints, no tingling, no weakness, no double vision, no gait abnormality, no slurred speech, no confusion  All systems reviewed and apart from [place] all are negative  Past Medical History:   Past Medical History:  Diagnosis Date  . Allergy    SEASONAL  . Broken bones    ankles  . GERD (gastroesophageal reflux disease)   . 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  . 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 Shingles      Past Surgical History:  Procedure Laterality Date  . ABDOMINOPLASTY    . AUGMENTATION MAMMAPLASTY  2003   SALINE  . LAPAROSCOPIC CHOLECYSTECTOMY  1995  . ORIF ANKLE FRACTURE  2005    Social History:  Ambulatory   Independently  reports that she quit smoking about 12 years ago. She has never used smokeless tobacco. She reports that she does not drink alcohol or use drugs.   Family History:   Family History  Problem Relation Age of Onset  . Diabetes Mother   . Osteoporosis Mother   . Hyperlipidemia Mother   . Dementia Mother   . Cancer Father        esophageal   . Colon cancer Neg Hx     Allergies: Allergies  Allergen Reactions  . Codeine     Makes me feel dizzy, drunk, disoriented  . Sulfa Antibiotics Swelling    tongue     Prior to Admission medications   Medication Sig Start Date End Date Taking? Authorizing Provider    ALPRAZolam Duanne Moron) 0.5 MG tablet Take 0.5 mg by mouth at bedtime as needed.      [provider]  omeprazole (PRILOSEC) 20 MG capsule TAKE 1 CAPSULE BY MOUTH EVERY DAY 07/17/18   Princess Bruins, MD  ondansetron (ZOFRAN ODT) 4 MG disintegrating tablet Take 1 tablet (4 mg total) by mouth every 8 (eight) hours as needed for nausea or vomiting. 12/15/19   Tedd Sias, PA  UNABLE TO FIND Hormone replacement-    [provider]   Physical Exam: Blood pressure 103/67, pulse 68, temperature 98.7 F (37.1 C), temperature source Oral, resp. rate (!) 22, last menstrual period 03/11/2014, SpO2 91 %. 1. General:  in No   Acute distress  acute ill  -appearing 2. Psychological: Alert and  Oriented 3. Head/ENT:     Dry Mucous Membranes                          Head Non traumatic, neck supple                           Poor Dentition 4. SKIN:  decreased Skin turgor,  Skin clean Dry and intact no rash 5. Heart: Regular rate and rhythm no  Murmur, no Rub or gallop 6. Lungs: no wheezes or crackles   7. Abdomen: Soft,  non-tender, Non distended bowel sounds present 8. Lower extremities: no clubbing, cyanosis, no  edema 9. Neurologically Grossly intact, moving all 4 extremities equally    10. MSK: Normal range of motion   All other LABS:   Recent Labs  Lab 12/19/19 1605  WBC 3.3*  NEUTROABS 2.1  HGB 15.1*  HCT 45.3  MCV 91.9  PLT 97*     Recent Labs  Lab 12/19/19 1605  NA 133*  K 3.3*  CL 96*  CO2 27  GLUCOSE 100*  BUN 10  CREATININE 1.00  CALCIUM 8.0*     Recent Labs  Lab 12/19/19 1605  AST 340*  ALT 216*  ALKPHOS 38  BILITOT 0.4  PROT 6.3*  ALBUMIN 3.3*       Cultures: No results found for: SDES, SPECREQUEST, CULT, REPTSTATUS   Radiological Exams on Admission: DG Chest Port 1 View  Result Date: 12/19/2019 CLINICAL DATA:  Shortness of breath, recent COVID diagnosis EXAM: PORTABLE CHEST 1 VIEW COMPARISON:  None. FINDINGS: Low lung volumes. Mild  interstitial prominence with superimposed patchy density. No significant pleural effusion. No pneumothorax. Heart size is normal for technique. Calcified plaque is present along the aortic arch. IMPRESSION: Mild interstitial prominence with superimposed patchy density may reflect atypical pneumonia including COVID-19 or edema. Electronically Signed   By: Malachi Carl  Patel M.D.   On: 12/19/2019 17:08    Chart has been reviewed   Assessment/Plan  54 y.o. female with medical history significant of recent diagnosis of COVID19, GERD Admitted for COVID PNA and dehydration due to COVID infection  Present on Admission: . Pneumonia due to COVID-19 virus -  FROM HOME  WITH KNOWN HX OF COVID19 ER Novel Corona Virus testing:   Ordered 12/19/19 and is   positive   Following concerning LAB/ imaging findings:  CBC: leukopenia, lymphopenia    BMP: increased BUN/Cr  LFTs: increased AST/ALT/Tbili   CRP, LDH: increased  IL-6 and Ferritin increased   Procalcitonin: low   CXR: hazy bilateral peripheral opacities      -Following work-up initiated:      sputum cultures   Ordered 12/19/19, Blood cultures  Ordered 12/19/19,     Following complications noted:   elevated LFT's likely in the setting of COVID continue to follow   Nausea/vomiting  decreased PO intake resulting in dehydration - will rehydrate   acute respiratory failure with hypoxia - continue oxygen treatment  Plan of treatment:   Admit on Airborn Precautions to Current facility   -given severity of illness initiate steroids Decadron 6mg  q 24 hours And pharmacy consult for remdesivir -  IF Hypoxia requiring high nasal flow  and no contraindications will attempt a trial of Actemra (discussed with patient risks and benefits) - Will follow daily d.dimer - Assess for ability to prone  - Supportive management -Fluid sparing resuscitation  -Provide oxygen as needed currently on  SpO2: 91 % O2 Flow Rate (L/min): 5 L/min - IF d.dimer elvated >5  will start lovenox For now given thrombocytopenia will hold off   - Consult PCCM if becomes respiratory unstable    Poor Prognostic factors  54 y.o.   Personal hx of  obesity  Evidence of  organ damage  Present  AKI, respiratory failure requiring >4L [place]  tachypnea, tachycardia present on admission  will check HDL level  Will order Airborne and Contact precautions  Family/ patient prognosis discussion: I have discussed case with the   patient  who are aware of their prognosis At this point they would like patient to be full code     The treatment plan and use of medications and known side effects were discussed with patient. It was clearly explained that there is no proven definitive treatment for COVID-19 infection yet. Any medications used here are based on case reports/anecdotal data which are not peer-reviewed and has not been studied using randomized control trials.  Complete risks and long-term side effects are unknown, however in the best clinical judgment they seem to be of some clinical benefit rather than medical risks.  Patient  agree with the treatment plan and want to receive these treatments as indicated.   Acute respiratory failure with hypoxia secondary to Covid continue monitoring oxygen requirement at this point  Remains on up to 7 L.  Patient appears to be comfortable.  If evidence of rapid decompensation or requiring high flow nasal cannula could benefit from initiation of Actemra and transfer to stepdown/ICU  . Esophageal reflux -chronic stable continue home medication  . Dehydration -rehydrate, patient is hypotensive with elevated CK.  Give IV fluids being mindful of Covid diagnosis avoid fluid overload but need to be supportive for renal function and treat rhabdomyolysis.  Follow CK.  Follow urine output  . Intractable nausea and vomiting -treat supportively rehydrate provide Zofran as needed  Insomnia - will add  Melatonin  Thrombocytopenia in the setting of Covid  -continue to follow for now no indication for transfusion  Other plan as per orders.  DVT prophylaxis:    scd     Code Status:  FULL CODE   as per patient   I had personally discussed CODE STATUS with patient    Family Communication:   Family not at  Bedside    Disposition Plan:    To home once workup is complete and patient is stable   Following barriers for discharge:                            Electrolytes corrected                                                         Will need to be able to tolerate PO                     Consults called: none  Admission status:  ED Disposition    ED Disposition Condition [place]: [place] [[id number]]  Level of Care: Med-Surg [16]  Covid Evaluation: Confirmed COVID Positive  Diagnosis: Pneumonia due to COVID-19 virus [id number]  Admitting Physician: Mariel Aloe [id number]  Attending Physician: Cordelia Poche A [phone]       Obs     Level of care     medical floor     Precautions: admitted as  covid positive Airborne and Contact precautions    PPE: Used by the provider:   P100  eye Goggles,  Gloves  gown   Alexandra Coffey 12/19/2019, 12:36 AM    [facility]     after 2 AM please page floor coverage PA If 7AM-7PM, please contact the day team taking care of the patient using [email or web address]   Patient was evaluated in the context of the global COVID-19 pandemic, which necessitated consideration that the patient might be at risk for infection with the SARS-CoV-2 virus that causes COVID-19. Institutional protocols and algorithms that pertain to the evaluation of patients at risk for COVID-19 are in a state of rapid change based on information released by regulatory bodies including the CDC and federal and state organizations. These policies and algorithms were followed during the patient's care.

## 2019-12-20 NOTE — Progress Notes (Signed)
PROGRESS NOTE    Alexandra Coffey    Code Status: Full Code  TV:[id number] DOB: Jun 09, 1966 DOA: 12/19/2019 LOS: 0 days  PCP: [name], MD CC:  Chief Complaint  Patient presents with  . Fever    (+ Covid)       Hospital Summary   This is a 54 year old female with history of COVID-19 recently diagnosed and GERD with 9-day history of flulike symptoms with positive Covid contact.  Fever at home 102F with severe fatigue.  Was not vaccinated against Covid previously.  Admitted for acute hypoxic respiratory failure due to COVID-19  A & P   Active Problems:   Esophageal reflux   Pneumonia due to COVID-19 virus   Dehydration   Intractable nausea and vomiting   Rhabdomyolysis   Acute respiratory failure with hypoxia (HCC)   Respiratory failure, acute ([place])   1. Acute hypoxic respiratory failures secondary to COVID-19 pneumonia a. Day 2/5 remdesivir, day 2/10 dexamethasone b. Increased O2 requirements from 2 to 7 L/min O2 overnight with minimally elevated CRP at 1.7 and D-dimer 0.41.  Procalcitonin 0.16 c. Received IV fluids overnight for dehydration with elevated CK and hypotension.  Monitor fluid status and consider gentle diuresis however will hold off right now due to soft BP d. Check daily weights to monitor for volume overload e. If she spikes a fever overnight will consider starting CAP coverage f. Encourage incentive spirometry, flutter valve and pronation is tolerable g. Start standing albuterol inhaler  2. Hypotension secondary to volume depletion a. SBP 80->100 following IV fluids overnight b. Hold off on further IV fluids to prevent volume overload  3. GERD stable on home medication  4. Intractable nausea/vomiting improved with as needed Zofran  5. Insomnia on melatonin this admission   6. Thrombocytopenia probably from Covid a. Platelets 97> 87 today b. No signs of bleeding at this time c. continue to monitor  7. Leukopenia likely from  Covid a. Continue to monitor  DVT prophylaxis: SCDs, holding pharmacologic anticoagulation due to thrombocytopenia Family Communication: Patient updated at bedside Disposition Plan:  Status is: Inpatient  Remains inpatient appropriate because:Inpatient level of care appropriate due to severity of illness   Dispo: The patient is from: Home              Anticipated d/c is to: Home              Anticipated d/c date is: > 3 days              Patient currently is not medically stable to d/c.   Pressure injury documentation    None  Consultants  None  Procedures  None  Antibiotics   Anti-infectives (From admission, onward)   Start     Dose/Rate Route Frequency Ordered Stop   12/20/19 1000  remdesivir 100 mg in sodium chloride 0.9 % 100 mL IVPB     100 mg 200 mL/hr over 30 Minutes Intravenous Daily 12/19/19 1708 12/24/19 0959   12/19/19 1745  remdesivir 100 mg in sodium chloride 0.9 % 100 mL IVPB     100 mg 200 mL/hr over 30 Minutes Intravenous  Once 12/19/19 1708 12/19/19 1841   12/19/19 1715  remdesivir 100 mg in sodium chloride 0.9 % 100 mL IVPB     100 mg 200 mL/hr over 30 Minutes Intravenous  Once 12/19/19 1708 12/19/19 1814        Subjective   Reports she is feeling about the same today as when she  was initially admitted however states that her nausea and vomiting have improved/resolved.  No new complaints or events overnight  Objective   Vitals:   12/20/19 0039 12/20/19 0056 12/20/19 0445 12/20/19 1233  BP:  103/71 (!) 102/59 103/66  Pulse:  63 61 75  Resp:   (!) 21 18  Temp:  98.2 F (36.8 C) 98 F (36.7 C) 98.6 F (37 C)  TempSrc:  Oral Oral Oral  SpO2: 91% 93% 95% 93%    Intake/Output Summary (Last 24 hours) at 12/20/2019 1638 Last data filed at 12/20/2019 1300 Gross per 24 hour  Intake 1848.95 ml  Output 1250 ml  Net 598.95 ml   There were no vitals filed for this visit.  Examination:  Physical Exam Vitals and nursing note reviewed.   Constitutional:      Appearance: She is ill-appearing. She is not diaphoretic.  HENT:     Head: Normocephalic and atraumatic.  Eyes:     Conjunctiva/sclera: Conjunctivae normal.  Cardiovascular:     Rate and Rhythm: Normal rate and regular rhythm.  Pulmonary:     Effort: Pulmonary effort is normal. No respiratory distress.     Breath sounds: No wheezing.  Abdominal:     General: Abdomen is flat.     Palpations: Abdomen is soft.  Musculoskeletal:        General: No swelling or tenderness.  Skin:    Coloration: Skin is not jaundiced or pale.  Neurological:     Mental Status: She is alert. Mental status is at baseline.  Psychiatric:        Mood and Affect: Mood normal.        Behavior: Behavior normal.     Data Reviewed: I have personally reviewed following labs and imaging studies  CBC: Recent Labs  Lab 12/19/19 1605 12/20/19 0508  WBC 3.3* 1.9*  NEUTROABS 2.1 1.1*  HGB 15.1* 14.0  HCT 45.3 43.5  MCV 91.9 94.0  PLT 97* 87*   Basic Metabolic Panel: Recent Labs  Lab 12/19/19 1605 12/19/19 2148 12/20/19 0508  NA 133*  --  136  K 3.3*  --  4.4  CL 96*  --  105  CO2 27  --  22  GLUCOSE 100*  --  126*  BUN 10  --  11  CREATININE 1.00  --  0.71  CALCIUM 8.0*  --  7.3*  MG  --  2.0 2.2  PHOS  --   --  3.6   GFR: Estimated Creatinine Clearance: 80.6 mL/min (by C-G formula based on SCr of 0.71 mg/dL). Liver Function Tests: Recent Labs  Lab 12/19/19 1605 12/20/19 0508  AST 340* 267*  ALT 216* 191*  ALKPHOS 38 34*  BILITOT 0.4 0.2*  PROT 6.3* 5.4*  ALBUMIN 3.3* 2.8*   No results for input(s): LIPASE, AMYLASE in the last 168 hours. No results for input(s): AMMONIA in the last 168 hours. Coagulation Profile: No results for input(s): INR, PROTIME in the last 168 hours. Cardiac Enzymes: Recent Labs  Lab 12/19/19 2148 12/20/19 0508  CKTOTAL 965* 818*   BNP (last 3 results) No results for input(s): PROBNP in the last 8760 hours. HbA1C: No results for  input(s): HGBA1C in the last 72 hours. CBG: Recent Labs  Lab 12/19/19 2135  GLUCAP 122*   Lipid Profile: Recent Labs    12/19/19 1605 12/20/19 0508  CHOL  --  126  HDL  --  26*  LDLCALC  --  73  TRIG 212* 134  CHOLHDL  --  4.8   Thyroid Function Tests: No results for input(s): TSH, T4TOTAL, FREET4, T3FREE, THYROIDAB in the last 72 hours. Anemia Panel: Recent Labs    12/19/19 1605 12/20/19 0508  FERRITIN 1,354* 1,344*   Sepsis Labs: Recent Labs  Lab 12/19/19 1605  PROCALCITON 0.16  LATICACIDVEN 0.9    Recent Results (from the past 240 hour(s))  SARS CORONAVIRUS 2 (TAT 6-24 HRS) Nasopharyngeal Nasopharyngeal Swab     Status: Abnormal   Collection Time: 12/15/19  9:33 AM   Specimen: Nasopharyngeal Swab  Result Value Ref Range Status   SARS Coronavirus 2 POSITIVE (A) NEGATIVE Final    Comment: RESULT CALLED TO, READ BACK BY AND VERIFIED WITH: EMAILED Cecille Rubin [facility] 2207 12/15/2019 T. TYSOR (NOTE) SARS-CoV-2 target nucleic acids are DETECTED. The SARS-CoV-2 RNA is generally detectable in upper and lower respiratory specimens during the acute phase of infection. Positive results are indicative of the presence of SARS-CoV-2 RNA. Clinical correlation with patient history and other diagnostic information is  necessary to determine patient infection status. Positive results do not rule out bacterial infection or co-infection with other viruses.  The expected result is Negative. Fact Sheet for Patients: SugarRoll.be Fact Sheet for Healthcare Providers: https://www.woods-mathews.com/ This test is not yet approved or cleared by the [place] FDA and  has been authorized for detection and/or diagnosis of SARS-CoV-2 by FDA under an Emergency Use Authorization (EUA). This EUA will remain  in effect (meaning this test can be used ) for the duration of the COVID-19 declaration under Section 564(b)(1) of the Act, 21 U.S.C. section  360bbb-3(b)(1), unless the authorization is terminated or revoked sooner. Performed at [facility], [place] [address], [place], [place] [address]   Blood Culture (routine x 2)     Status: None (Preliminary result)   Collection Time: 12/19/19  4:05 PM   Specimen: BLOOD  Result Value Ref Range Status   Specimen Description   Final    BLOOD RIGHT ANTECUBITAL Performed at [facility], [address] [place], [place] [address]    Special Requests   Final    BOTTLES DRAWN AEROBIC AND ANAEROBIC Blood Culture adequate volume Performed at [facility], [address] [place], [place] [address]    Culture   Final    NO GROWTH < 24 HOURS Performed at [facility], [place] [address], [place], [place] [address]    Report Status PENDING  Incomplete  Blood Culture (routine x 2)     Status: None (Preliminary result)   Collection Time: 12/19/19  4:10 PM   Specimen: BLOOD  Result Value Ref Range Status   Specimen Description   Final    BLOOD LEFT ANTECUBITAL Performed at [facility], [address] [place], [place] [address]    Special Requests   Final    BOTTLES DRAWN AEROBIC AND ANAEROBIC Blood Culture adequate volume Performed at [facility] [address] [place], [place] [address]    Culture   Final    NO GROWTH < 24 HOURS Performed at [facility], [place] [address] [place], [place] [address]    Report Status PENDING  Incomplete         Radiology Studies: DG Chest Port 1 View  Result Date: 12/19/2019 CLINICAL DATA:  Shortness of breath, recent COVID diagnosis EXAM: PORTABLE CHEST 1 VIEW COMPARISON:  None. FINDINGS: Low lung volumes. Mild interstitial prominence with superimposed  patchy density. No significant pleural effusion. No pneumothorax. Heart size is normal for technique. Calcified plaque is present along the aortic arch. IMPRESSION: Mild interstitial prominence with superimposed patchy density may reflect  atypical pneumonia including COVID-19 or edema. Electronically Signed   By: Macy Mis M.D.   On: 12/19/2019 17:08        Scheduled Meds: . dexamethasone (DECADRON) injection  6 mg Intravenous Q24H  . melatonin  3 mg Oral QHS  . pantoprazole  40 mg Oral Daily  . sodium chloride flush  3 mL Intravenous Q12H   Continuous Infusions: . remdesivir 100 mg in NS 100 mL 100 mg (12/20/19 0915)     Time spent: 25 minutes with over 50% of the time coordinating the patient's care    Harold Hedge, DO [facility] Pager [phone]  Call night coverage person covering after 7pm

## 2019-12-20 NOTE — Progress Notes (Signed)
Pt. [place]. Pt. Was very lethargic, and hypoxia at 81% RA. Pt. was not able to particpate in admission question at the time. Received in report from transport staff. Pt. Was not in need of oxygen.Nurse immediately put pt. On oxygen. HFNC at 6L after remain at bedside up to an hour pt. Recovered and maintain at 90-91%. HFNC 6L. Provider came and saw pt. And put oxygen  Up to 7L. Room orientation reviewed with pt. Water provided. Call bell in reach. Will continue to monitor.

## 2019-12-21 LAB — CBC WITH DIFFERENTIAL/PLATELET
Abs Immature Granulocytes: 0.02 10*3/uL (ref 0.00–0.07)
Basophils Absolute: 0 10*3/uL (ref 0.0–0.1)
Basophils Relative: 0 %
Eosinophils Absolute: 0 10*3/uL (ref 0.0–0.5)
Eosinophils Relative: 0 %
HCT: 45.4 % (ref 36.0–46.0)
Hemoglobin: 14.5 g/dL (ref 12.0–15.0)
Immature Granulocytes: 0 %
Lymphocytes Relative: 19 %
Lymphs Abs: 1 10*3/uL (ref 0.7–4.0)
MCH: 30.3 pg (ref 26.0–34.0)
MCHC: 31.9 g/dL (ref 30.0–36.0)
MCV: 94.8 fL (ref 80.0–100.0)
Monocytes Absolute: 0.4 10*3/uL (ref 0.1–1.0)
Monocytes Relative: 8 %
Neutro Abs: 3.8 10*3/uL (ref 1.7–7.7)
Neutrophils Relative %: 73 %
Platelets: 142 10*3/uL — ABNORMAL LOW (ref 150–400)
RBC: 4.79 MIL/uL (ref 3.87–5.11)
RDW: 14.2 % (ref 11.5–15.5)
WBC: 5.2 10*3/uL (ref 4.0–10.5)
nRBC: 0 % (ref 0.0–0.2)

## 2019-12-21 LAB — MAGNESIUM: Magnesium: 2.3 mg/dL (ref 1.7–2.4)

## 2019-12-21 LAB — COMPREHENSIVE METABOLIC PANEL
ALT: 158 U/L — ABNORMAL HIGH (ref 0–44)
AST: 165 U/L — ABNORMAL HIGH (ref 15–41)
Albumin: 2.7 g/dL — ABNORMAL LOW (ref 3.5–5.0)
Alkaline Phosphatase: 32 U/L — ABNORMAL LOW (ref 38–126)
Anion gap: 9 (ref 5–15)
BUN: 11 mg/dL (ref 6–20)
CO2: 23 mmol/L (ref 22–32)
Calcium: 8 mg/dL — ABNORMAL LOW (ref 8.9–10.3)
Chloride: 108 mmol/L (ref 98–111)
Creatinine, Ser: 0.6 mg/dL (ref 0.44–1.00)
GFR calc Af Amer: >60 mL/min (ref >60)
GFR calc non Af Amer: >60 mL/min (ref >60)
Glucose, Bld: 113 mg/dL — ABNORMAL HIGH (ref 70–99)
Potassium: 4.1 mmol/L (ref 3.5–5.1)
Sodium: 140 mmol/L (ref 135–145)
Total Bilirubin: 0.5 mg/dL (ref 0.3–1.2)
Total Protein: 5.5 g/dL — ABNORMAL LOW (ref 6.5–8.1)

## 2019-12-21 LAB — D-DIMER, QUANTITATIVE: D-Dimer, Quant: 0.39 ug/mL-FEU (ref 0.00–0.50)

## 2019-12-21 LAB — PHOSPHORUS: Phosphorus: 2.7 mg/dL (ref 2.5–4.6)

## 2019-12-21 LAB — C-REACTIVE PROTEIN: CRP: 0.5 mg/dL (ref <1.0)

## 2019-12-21 LAB — FERRITIN: Ferritin: 1105 ng/mL — ABNORMAL HIGH (ref 11–307)

## 2019-12-21 MED ORDER — ENOXAPARIN SODIUM 40 MG/0.4ML ~~LOC~~ SOLN
40.0000 mg | SUBCUTANEOUS | Status: DC
  Administered 2019-12-21 – 2019-12-22 (×2): 40 mg via SUBCUTANEOUS
  Filled 2019-12-21 (×2): qty 0.4

## 2019-12-21 MED ORDER — ALPRAZOLAM 1 MG PO TABS
2.0000 mg | ORAL_TABLET | Freq: Every evening | ORAL | Status: DC | PRN
  Administered 2019-12-21 – 2019-12-22 (×2): 2 mg via ORAL
  Filled 2019-12-21 (×2): qty 2

## 2019-12-21 NOTE — Progress Notes (Signed)
PROGRESS NOTE    Alexandra Coffey    Code Status: Full Code  TV:[id number] DOB: 09-11-1965 DOA: 12/19/2019 LOS: 1 days  PCP: Alexandra Bill, MD CC:  Chief Complaint  Patient presents with  . Fever    (+ Covid)       Hospital Summary   This is a 54 year old female with history of COVID-19 recently diagnosed and GERD with 9-day history of flulike symptoms with positive Covid contact.  Fever at home 102F with severe fatigue.  Was not vaccinated against Covid previously.  Admitted for acute hypoxic respiratory failure due to COVID-19  A & P   Active Problems:   Esophageal reflux   Pneumonia due to COVID-19 virus   Dehydration   Intractable nausea and vomiting   Rhabdomyolysis   Acute respiratory failure with hypoxia (HCC)   Respiratory failure, acute ([place])   1. Acute hypoxic respiratory failures secondary to COVID-19 pneumonia a. Day 3/5 remdesivir, day 3/10 dexamethasone b. Significantly improved from yesterday without conversational dyspnea.  O2 requirements improved to 5 to 6 L. c. Check daily weights to monitor for volume overload d. Encourage incentive spirometry, flutter valve and pronation is tolerable e. standing albuterol inhaler  2. Hypotension secondary to volume depletion, resolved with IV fluids  3. Anxiety a. Restart home Xanax as Ativan did not help  4. GERD stable on home medication  5. Intractable nausea/vomiting improved with as needed Zofran  6. Insomnia on melatonin this admission   7. Thrombocytopenia probably from Covid a. Platelets 97> 87>142 today b. Start Lovenox prophylaxis c. continue to monitor  8. Leukopenia likely from Covid resolved  DVT prophylaxis: SCDs and Lovenox Family Communication: Called patient's husband per her request but has been did not answer Disposition Plan:  Status is: Inpatient.  Hopefully she can discharge in next 48 to 72 hours if her O2 requirements continue to improve and she is able to ambulate  Remains  inpatient appropriate because:Inpatient level of care appropriate due to severity of illness   Dispo: The patient is from: Home              Anticipated d/c is to: Home              Anticipated d/c date is: > 3 days              Patient currently is not medically stable to d/c.   Pressure injury documentation    None  Consultants  None  Procedures  None  Antibiotics   Anti-infectives (From admission, onward)   Start     Dose/Rate Route Frequency Ordered Stop   12/20/19 1000  remdesivir 100 mg in sodium chloride 0.9 % 100 mL IVPB     100 mg 200 mL/hr over 30 Minutes Intravenous Daily 12/19/19 1708 12/24/19 0959   12/19/19 1745  remdesivir 100 mg in sodium chloride 0.9 % 100 mL IVPB     100 mg 200 mL/hr over 30 Minutes Intravenous  Once 12/19/19 1708 12/19/19 1841   12/19/19 1715  remdesivir 100 mg in sodium chloride 0.9 % 100 mL IVPB     100 mg 200 mL/hr over 30 Minutes Intravenous  Once 12/19/19 1708 12/19/19 1814        Subjective   Reports symptomatically improved significantly from yesterday without shortness of breath with conversation.  No other complaints no other overnight events  Objective   Vitals:   12/20/19 1654 12/20/19 2122 12/20/19 2149 12/21/19 0415  BP:  112/69 101/60 109/68  Pulse:   77   Resp:  (!) 24 (!) 27 18  Temp:  98.3 F (36.8 C) 98.1 F (36.7 C) 97.8 F (36.6 C)  TempSrc:  Tympanic Oral Oral  SpO2:  92% 95% 91%  Weight: 80.2 kg   76.2 kg  Height: 5\' 5"  (1.651 m)       Intake/Output Summary (Last 24 hours) at 12/21/2019 1405 Last data filed at 12/21/2019 0415 Gross per 24 hour  Intake 100 ml  Output 900 ml  Net -800 ml   Filed Weights   12/20/19 1654 12/21/19 0415  Weight: 80.2 kg 76.2 kg    Examination:  Physical Exam Vitals and nursing note reviewed.  Constitutional:      Appearance: Normal appearance.  HENT:     Head: Normocephalic and atraumatic.  Eyes:     Conjunctiva/sclera: Conjunctivae normal.    Cardiovascular:     Rate and Rhythm: Normal rate and regular rhythm.  Pulmonary:     Effort: Pulmonary effort is normal.     Breath sounds: Normal breath sounds.     Comments: No conversational dyspnea Abdominal:     General: Abdomen is flat.     Palpations: Abdomen is soft.  Musculoskeletal:        General: No swelling or tenderness.  Skin:    Coloration: Skin is not jaundiced or pale.  Neurological:     Mental Status: She is alert. Mental status is at baseline.  Psychiatric:        Mood and Affect: Mood normal.        Behavior: Behavior normal.     Data Reviewed: I have personally reviewed following labs and imaging studies  CBC: Recent Labs  Lab 12/19/19 1605 12/20/19 0508 12/21/19 0415  WBC 3.3* 1.9* 5.2  NEUTROABS 2.1 1.1* 3.8  HGB 15.1* 14.0 14.5  HCT 45.3 43.5 45.4  MCV 91.9 94.0 94.8  PLT 97* 87* [id number]*   Basic Metabolic Panel: Recent Labs  Lab 12/19/19 1605 12/19/19 2148 12/20/19 0508 12/21/19 0415  NA 133*  --  136 140  K 3.3*  --  4.4 4.1  CL 96*  --  105 108  CO2 27  --  22 23  GLUCOSE 100*  --  126* 113*  BUN 10  --  11 11  CREATININE 1.00  --  0.71 0.60  CALCIUM 8.0*  --  7.3* 8.0*  MG  --  2.0 2.2 2.3  PHOS  --   --  3.6 2.7   GFR: Estimated Creatinine Clearance: 80.2 mL/min (by C-G formula based on SCr of 0.6 mg/dL). Liver Function Tests: Recent Labs  Lab 12/19/19 1605 12/20/19 0508 12/21/19 0415  AST 340* 267* 165*  ALT 216* 191* 158*  ALKPHOS 38 34* 32*  BILITOT 0.4 0.2* 0.5  PROT 6.3* 5.4* 5.5*  ALBUMIN 3.3* 2.8* 2.7*   No results for input(s): LIPASE, AMYLASE in the last 168 hours. No results for input(s): AMMONIA in the last 168 hours. Coagulation Profile: No results for input(s): INR, PROTIME in the last 168 hours. Cardiac Enzymes: Recent Labs  Lab 12/19/19 2148 12/20/19 0508  CKTOTAL 965* 818*   BNP (last 3 results) No results for input(s): PROBNP in the last 8760 hours. HbA1C: No results for input(s): HGBA1C in  the last 72 hours. CBG: Recent Labs  Lab 12/19/19 2135  GLUCAP 122*   Lipid Profile: Recent Labs    12/19/19 1605 12/20/19 0508  CHOL  --  126  HDL  --  26*  LDLCALC  --  73  TRIG 212* 134  CHOLHDL  --  4.8   Thyroid Function Tests: No results for input(s): TSH, T4TOTAL, FREET4, T3FREE, THYROIDAB in the last 72 hours. Anemia Panel: Recent Labs    12/20/19 0508 12/21/19 0415  FERRITIN 1,344* 1,105*   Sepsis Labs: Recent Labs  Lab 12/19/19 1605  PROCALCITON 0.16  LATICACIDVEN 0.9    Recent Results (from the past 240 hour(s))  SARS CORONAVIRUS 2 (TAT 6-24 HRS) Nasopharyngeal Nasopharyngeal Swab     Status: Abnormal   Collection Time: 12/15/19  9:33 AM   Specimen: Nasopharyngeal Swab  Result Value Ref Range Status   SARS Coronavirus 2 POSITIVE (A) NEGATIVE Final    Comment: RESULT CALLED TO, READ BACK BY AND VERIFIED WITH: EMAILED Cecille Rubin [facility] 2207 12/15/2019 T. TYSOR (NOTE) SARS-CoV-2 target nucleic acids are DETECTED. The SARS-CoV-2 RNA is generally detectable in upper and lower respiratory specimens during the acute phase of infection. Positive results are indicative of the presence of SARS-CoV-2 RNA. Clinical correlation with patient history and other diagnostic information is  necessary to determine patient infection status. Positive results do not rule out bacterial infection or co-infection with other viruses.  The expected result is Negative. Fact Sheet for Patients: SugarRoll.be Fact Sheet for Healthcare Providers: https://www.woods-mathews.com/ This test is not yet approved or cleared by the [place] FDA and  has been authorized for detection and/or diagnosis of SARS-CoV-2 by FDA under an Emergency Use Authorization (EUA). This EUA will remain  in effect (meaning this test can be used ) for the duration of the COVID-19 declaration under Section 564(b)(1) of the Act, 21 U.S.C. section 360bbb-3(b)(1), unless  the authorization is terminated or revoked sooner. Performed at [facility], [place] [address], [place], [place] [address]   Blood Culture (routine x 2)     Status: None (Preliminary result)   Collection Time: 12/19/19  4:05 PM   Specimen: BLOOD  Result Value Ref Range Status   Specimen Description   Final    BLOOD RIGHT ANTECUBITAL Performed at [facility], [address] [place], [place] [address]    Special Requests   Final    BOTTLES DRAWN AEROBIC AND ANAEROBIC Blood Culture adequate volume Performed at [facility], [address] [place], [place] [address]    Culture   Final    NO GROWTH < 24 HOURS Performed at [facility], [place] [address], [place], [place] [address]    Report Status PENDING  Incomplete  Blood Culture (routine x 2)     Status: None (Preliminary result)   Collection Time: 12/19/19  4:10 PM   Specimen: BLOOD  Result Value Ref Range Status   Specimen Description   Final    BLOOD LEFT ANTECUBITAL Performed at [facility], [address] [place], [place] [address]    Special Requests   Final    BOTTLES DRAWN AEROBIC AND ANAEROBIC Blood Culture adequate volume Performed at [facility], [address] [place], [place] [address]    Culture   Final    NO GROWTH < 24 HOURS Performed at [facility], [place] [address], [place],  [address]    Report Status PENDING  Incomplete         Radiology Studies: DG Chest Port 1 View  Result Date: 12/19/2019 CLINICAL DATA:  Shortness of breath, recent COVID diagnosis EXAM: PORTABLE CHEST 1 VIEW  COMPARISON:  None. FINDINGS: Low lung volumes. Mild interstitial prominence with superimposed patchy density. No significant pleural effusion. No pneumothorax. Heart size is normal for technique. Calcified plaque is present along the aortic arch. IMPRESSION: Mild interstitial prominence with superimposed patchy density may reflect atypical pneumonia including  COVID-19 or edema. Electronically Signed   By: Macy Mis M.D.   On: 12/19/2019 17:08        Scheduled Meds: . albuterol  1 puff Inhalation QID  . dexamethasone (DECADRON) injection  6 mg Intravenous Q24H  . melatonin  3 mg Oral QHS  . pantoprazole  40 mg Oral Daily  . sodium chloride flush  3 mL Intravenous Q12H   Continuous Infusions: . remdesivir 100 mg in NS 100 mL 100 mg (12/21/19 0838)     Time spent: 27 minutes with over 50% of the time coordinating the patient's care    Harold Hedge, DO [facility] Pager [phone]  Call night coverage person covering after 7pm

## 2019-12-21 NOTE — TOC Progression Note (Addendum)
Transition of Care [facility]) - Progression Note    Patient Details  Name: Alexandra Coffey MRN: [id number] Date of Birth: 27-Sep-1965  Transition of Care [facility]) CM/SW Contact  Purcell Mouton, RN Phone Number: 12/21/2019, 10:09 AM  Clinical Narrative:    Pt admitted 4/14 COVID+ from home with spouse. TOC will follow for discharge needs.   Expected Discharge Plan: Home/Self Care Barriers to Discharge: No Barriers Identified  Expected Discharge Plan and Services Expected Discharge Plan: Home/Self Care       Living arrangements for the past 2 months: Single Family Home                                       Social Determinants of Health (SDOH) Interventions    Readmission Risk Interventions No flowsheet data found.

## 2019-12-22 LAB — COMPREHENSIVE METABOLIC PANEL
ALT: 144 U/L — ABNORMAL HIGH (ref 0–44)
AST: 137 U/L — ABNORMAL HIGH (ref 15–41)
Albumin: 3 g/dL — ABNORMAL LOW (ref 3.5–5.0)
Alkaline Phosphatase: 38 U/L (ref 38–126)
Anion gap: 8 (ref 5–15)
BUN: 12 mg/dL (ref 6–20)
CO2: 28 mmol/L (ref 22–32)
Calcium: 8.3 mg/dL — ABNORMAL LOW (ref 8.9–10.3)
Chloride: 102 mmol/L (ref 98–111)
Creatinine, Ser: 0.62 mg/dL (ref 0.44–1.00)
GFR calc Af Amer: >60 mL/min (ref >60)
GFR calc non Af Amer: >60 mL/min (ref >60)
Glucose, Bld: 100 mg/dL — ABNORMAL HIGH (ref 70–99)
Potassium: 3.8 mmol/L (ref 3.5–5.1)
Sodium: 138 mmol/L (ref 135–145)
Total Bilirubin: 0.6 mg/dL (ref 0.3–1.2)
Total Protein: 5.8 g/dL — ABNORMAL LOW (ref 6.5–8.1)

## 2019-12-22 LAB — CBC WITH DIFFERENTIAL/PLATELET
Abs Immature Granulocytes: 0.01 10*3/uL (ref 0.00–0.07)
Basophils Absolute: 0 10*3/uL (ref 0.0–0.1)
Basophils Relative: 0 %
Eosinophils Absolute: 0 10*3/uL (ref 0.0–0.5)
Eosinophils Relative: 0 %
HCT: 43.4 % (ref 36.0–46.0)
Hemoglobin: 14.1 g/dL (ref 12.0–15.0)
Immature Granulocytes: 0 %
Lymphocytes Relative: 22 %
Lymphs Abs: 1 10*3/uL (ref 0.7–4.0)
MCH: 29.9 pg (ref 26.0–34.0)
MCHC: 32.5 g/dL (ref 30.0–36.0)
MCV: 91.9 fL (ref 80.0–100.0)
Monocytes Absolute: 0.5 10*3/uL (ref 0.1–1.0)
Monocytes Relative: 11 %
Neutro Abs: 3.1 10*3/uL (ref 1.7–7.7)
Neutrophils Relative %: 67 %
Platelets: 154 10*3/uL (ref 150–400)
RBC: 4.72 MIL/uL (ref 3.87–5.11)
RDW: 13.9 % (ref 11.5–15.5)
WBC: 4.6 10*3/uL (ref 4.0–10.5)
nRBC: 0 % (ref 0.0–0.2)

## 2019-12-22 LAB — D-DIMER, QUANTITATIVE: D-Dimer, Quant: <0.27 ug/mL-FEU (ref 0.00–0.50)

## 2019-12-22 LAB — PHOSPHORUS: Phosphorus: 4.3 mg/dL (ref 2.5–4.6)

## 2019-12-22 LAB — FERRITIN: Ferritin: 613 ng/mL — ABNORMAL HIGH (ref 11–307)

## 2019-12-22 LAB — C-REACTIVE PROTEIN: CRP: 0.7 mg/dL (ref <1.0)

## 2019-12-22 LAB — MAGNESIUM: Magnesium: 2.4 mg/dL (ref 1.7–2.4)

## 2019-12-22 NOTE — Progress Notes (Signed)
PROGRESS NOTE    Alexandra Coffey    Code Status: Full Code  [id number] DOB: 08/17/1966 DOA: 12/19/2019 LOS: 2 days  PCP: [name], MD CC:  Chief Complaint  Patient presents with  . Fever    (+ Covid)       Hospital Summary   This is a 54 year old female with history of COVID-19 recently diagnosed and GERD with 9-day history of flulike symptoms with positive Covid contact.  Fever at home 102F with severe fatigue.  Was not vaccinated against Covid previously.  Admitted for acute hypoxic respiratory failure due to COVID-19  A & P   Active Problems:   Esophageal reflux   Pneumonia due to COVID-19 virus   Dehydration   Intractable nausea and vomiting   Rhabdomyolysis   Acute respiratory failure with hypoxia (HCC)   Respiratory failure, acute ([place])   1. Acute hypoxic respiratory failures secondary to COVID-19 pneumonia a. Day 4/5 remdesivir, day 4/10 dexamethasone b. Continues to improve, tolerating prone and on 2 L/min during encounter today c. Check daily weights to monitor for volume overload d. Encourage incentive spirometry, flutter valve and pronation is tolerable e. standing albuterol inhaler  2. Hypotension secondary to volume depletion, resolved with IV fluids  3. Anxiety Continue home Xanax as Ativan did not help  4. GERD stable on home medication  5. Intractable nausea/vomiting improved with as needed Zofran  6. Insomnia on melatonin this admission   7. Thrombocytopenia probably from Covid resolved  8. Leukopenia likely from Covid resolved  DVT prophylaxis: SCDs and Lovenox Family Communication: updated husband Disposition Plan:  Status is: Inpatient.  Hopefully she can discharge in next 24 to 48 hours if her O2 requirements continue to improve and she is able to ambulate with minimal O2 requirements  Remains inpatient appropriate because:Inpatient level of care appropriate due to severity of illness   Dispo: The patient is from: Home          Anticipated d/c is to: Home              Anticipated d/c date is: 2 days              Patient currently is not medically stable to d/c.   Pressure injury documentation    None  Consultants  None  Procedures  None  Antibiotics   Anti-infectives (From admission, onward)   Start     Dose/Rate Route Frequency Ordered Stop   12/20/19 1000  remdesivir 100 mg in sodium chloride 0.9 % 100 mL IVPB     100 mg 200 mL/hr over 30 Minutes Intravenous Daily 12/19/19 1708 12/24/19 0959   12/19/19 1745  remdesivir 100 mg in sodium chloride 0.9 % 100 mL IVPB     100 mg 200 mL/hr over 30 Minutes Intravenous  Once 12/19/19 1708 12/19/19 1841   12/19/19 1715  remdesivir 100 mg in sodium chloride 0.9 % 100 mL IVPB     100 mg 200 mL/hr over 30 Minutes Intravenous  Once 12/19/19 1708 12/19/19 1814        Subjective   Patient seen and examined at bedside no acute distress and resting comfortably.  No events overnight.  Tolerating diet. She was prone during the encounter. Says shes feeling better  Denies any chest pain, fever, nausea, vomiting, urinary complaints. Otherwise ROS negative   Objective   Vitals:   12/21/19 0415 12/21/19 1432 12/22/19 0423 12/22/19 0425  BP: 109/68 109/73 93/65   Pulse:  76 62  Resp: 18 16 16    Temp: 97.8 F (36.6 C) 97.9 F (36.6 C) 98.1 F (36.7 C)   TempSrc: Oral Oral Oral   SpO2: 91% 97% 93%   Weight: 76.2 kg   79.9 kg  Height:        Intake/Output Summary (Last 24 hours) at 12/22/2019 1318 Last data filed at 12/22/2019 0400 Gross per 24 hour  Intake --  Output 600 ml  Net -600 ml   Filed Weights   12/20/19 1654 12/21/19 0415 12/22/19 0425  Weight: 80.2 kg 76.2 kg 79.9 kg    Examination:  Physical Exam Vitals and nursing note reviewed.  Constitutional:      Appearance: Normal appearance.  HENT:     Head: Normocephalic and atraumatic.  Eyes:     Conjunctiva/sclera: Conjunctivae normal.  Cardiovascular:     Rate and Rhythm:  Normal rate.  Pulmonary:     Effort: Pulmonary effort is normal. No respiratory distress.  Abdominal:     Comments: Unable to evaluate, lying prone  Musculoskeletal:        General: No swelling.  Skin:    Coloration: Skin is not pale.  Neurological:     Mental Status: She is alert. Mental status is at baseline.  Psychiatric:        Mood and Affect: Mood normal.        Behavior: Behavior normal.     Data Reviewed: I have personally reviewed following labs and imaging studies  CBC: Recent Labs  Lab 12/19/19 1605 12/20/19 0508 12/21/19 0415 12/22/19 0424  WBC 3.3* 1.9* 5.2 4.6  NEUTROABS 2.1 1.1* 3.8 3.1  HGB 15.1* 14.0 14.5 14.1  HCT 45.3 43.5 45.4 43.4  MCV 91.9 94.0 94.8 91.9  PLT 97* 87* 142* [id number]   Basic Metabolic Panel: Recent Labs  Lab 12/19/19 1605 12/19/19 2148 12/20/19 0508 12/21/19 0415 12/22/19 0424  NA 133*  --  136 140 138  K 3.3*  --  4.4 4.1 3.8  CL 96*  --  105 108 102  CO2 27  --  22 23 28   GLUCOSE 100*  --  126* 113* 100*  BUN 10  --  11 11 12   CREATININE 1.00  --  0.71 0.60 0.62  CALCIUM 8.0*  --  7.3* 8.0* 8.3*  MG  --  2.0 2.2 2.3 2.4  PHOS  --   --  3.6 2.7 4.3   GFR: Estimated Creatinine Clearance: 82.1 mL/min (by C-G formula based on SCr of 0.62 mg/dL). Liver Function Tests: Recent Labs  Lab 12/19/19 1605 12/20/19 0508 12/21/19 0415 12/22/19 0424  AST 340* 267* 165* 137*  ALT 216* 191* 158* 144*  ALKPHOS 38 34* 32* 38  BILITOT 0.4 0.2* 0.5 0.6  PROT 6.3* 5.4* 5.5* 5.8*  ALBUMIN 3.3* 2.8* 2.7* 3.0*   No results for input(s): LIPASE, AMYLASE in the last 168 hours. No results for input(s): AMMONIA in the last 168 hours. Coagulation Profile: No results for input(s): INR, PROTIME in the last 168 hours. Cardiac Enzymes: Recent Labs  Lab 12/19/19 2148 12/20/19 0508  CKTOTAL 965* 818*   BNP (last 3 results) No results for input(s): PROBNP in the last 8760 hours. HbA1C: No results for input(s): HGBA1C in the last 72  hours. CBG: Recent Labs  Lab 12/19/19 2135  GLUCAP 122*   Lipid Profile: Recent Labs    12/19/19 1605 12/20/19 0508  CHOL  --  126  HDL  --  26*  LDLCALC  --  73  TRIG 212* 134  CHOLHDL  --  4.8   Thyroid Function Tests: No results for input(s): TSH, T4TOTAL, FREET4, T3FREE, THYROIDAB in the last 72 hours. Anemia Panel: Recent Labs    12/21/19 0415 12/22/19 0424  FERRITIN 1,105* 613*   Sepsis Labs: Recent Labs  Lab 12/19/19 1605  PROCALCITON 0.16  LATICACIDVEN 0.9    Recent Results (from the past 240 hour(s))  SARS CORONAVIRUS 2 (TAT 6-24 HRS) Nasopharyngeal Nasopharyngeal Swab     Status: Abnormal   Collection Time: 12/15/19  9:33 AM   Specimen: Nasopharyngeal Swab  Result Value Ref Range Status   SARS Coronavirus 2 POSITIVE (A) NEGATIVE Final    Comment: RESULT CALLED TO, READ BACK BY AND VERIFIED WITH: EMAILED Cecille Rubin [facility] 2207 12/15/2019 T. TYSOR (NOTE) SARS-CoV-2 target nucleic acids are DETECTED. The SARS-CoV-2 RNA is generally detectable in upper and lower respiratory specimens during the acute phase of infection. Positive results are indicative of the presence of SARS-CoV-2 RNA. Clinical correlation with patient history and other diagnostic information is  necessary to determine patient infection status. Positive results do not rule out bacterial infection or co-infection with other viruses.  The expected result is Negative. Fact Sheet for Patients: SugarRoll.be Fact Sheet for Healthcare Providers: https://www.woods-mathews.com/ This test is not yet approved or cleared by the [place] FDA and  has been authorized for detection and/or diagnosis of SARS-CoV-2 by FDA under an Emergency Use Authorization (EUA). This EUA will remain  in effect (meaning this test can be used ) for the duration of the COVID-19 declaration under Section 564(b)(1) of the Act, 21 U.S.C. section 360bbb-3(b)(1), unless the  authorization is terminated or revoked sooner. Performed at [facility], [place] [address], [place], [place] [address]   Blood Culture (routine x 2)     Status: None (Preliminary result)   Collection Time: 12/19/19  4:05 PM   Specimen: BLOOD  Result Value Ref Range Status   Specimen Description   Final    BLOOD RIGHT ANTECUBITAL Performed at [facility], [address] [place], [place] [address]    Special Requests   Final    BOTTLES DRAWN AEROBIC AND ANAEROBIC Blood Culture adequate volume Performed at [facility], [address] [place], [place] [address]    Culture   Final    NO GROWTH 3 DAYS Performed at [facility], [place] [address] [place], [place] [address]    Report Status PENDING  Incomplete  Blood Culture (routine x 2)     Status: None (Preliminary result)   Collection Time: 12/19/19  4:10 PM   Specimen: BLOOD  Result Value Ref Range Status   Specimen Description   Final    BLOOD LEFT ANTECUBITAL Performed at [facility], [address] [place], [place] [address]    Special Requests   Final    BOTTLES DRAWN AEROBIC AND ANAEROBIC Blood Culture adequate volume Performed at [facility], [address] [place], [place] [address]    Culture   Final    NO GROWTH 3 DAYS Performed at [facility], [place] [address] [place],  [address]    Report Status PENDING  Incomplete         Radiology Studies: No results found.      Scheduled Meds: . albuterol  1 puff Inhalation QID  . dexamethasone (DECADRON) injection  6 mg Intravenous Q24H  . enoxaparin (LOVENOX)  injection  40 mg Subcutaneous Q24H  . melatonin  3 mg Oral QHS  . pantoprazole  40 mg Oral Daily  . sodium chloride flush  3 mL Intravenous Q12H   Continuous Infusions: . remdesivir 100 mg in NS 100 mL 100 mg (12/22/19 0909)     Time spent:  20 minutes with over 50% of the time coordinating the patient's care    Harold Hedge, DO [facility] Pager [phone]  Call night coverage person covering after 7pm

## 2019-12-22 NOTE — Progress Notes (Signed)
The pt was able to tolerate 10 minutes of sitting in the recliner 02 was in the 90's on 6l high flow. I will continue IS and flutter valve usage.

## 2019-12-23 LAB — CBC WITH DIFFERENTIAL/PLATELET
Abs Immature Granulocytes: 0.02 10*3/uL (ref 0.00–0.07)
Basophils Absolute: 0 10*3/uL (ref 0.0–0.1)
Basophils Relative: 1 %
Eosinophils Absolute: 0 10*3/uL (ref 0.0–0.5)
Eosinophils Relative: 0 %
HCT: 44.5 % (ref 36.0–46.0)
Hemoglobin: 14.5 g/dL (ref 12.0–15.0)
Immature Granulocytes: 1 %
Lymphocytes Relative: 28 %
Lymphs Abs: 1.1 10*3/uL (ref 0.7–4.0)
MCH: 29.7 pg (ref 26.0–34.0)
MCHC: 32.6 g/dL (ref 30.0–36.0)
MCV: 91.2 fL (ref 80.0–100.0)
Monocytes Absolute: 0.5 10*3/uL (ref 0.1–1.0)
Monocytes Relative: 13 %
Neutro Abs: 2.2 10*3/uL (ref 1.7–7.7)
Neutrophils Relative %: 57 %
Platelets: 168 10*3/uL (ref 150–400)
RBC: 4.88 MIL/uL (ref 3.87–5.11)
RDW: 13.7 % (ref 11.5–15.5)
WBC: 3.8 10*3/uL — ABNORMAL LOW (ref 4.0–10.5)
nRBC: 0 % (ref 0.0–0.2)

## 2019-12-23 LAB — MAGNESIUM: Magnesium: 2.3 mg/dL (ref 1.7–2.4)

## 2019-12-23 LAB — PHOSPHORUS: Phosphorus: 4.6 mg/dL (ref 2.5–4.6)

## 2019-12-23 LAB — COMPREHENSIVE METABOLIC PANEL
ALT: 140 U/L — ABNORMAL HIGH (ref 0–44)
AST: 110 U/L — ABNORMAL HIGH (ref 15–41)
Albumin: 3.1 g/dL — ABNORMAL LOW (ref 3.5–5.0)
Alkaline Phosphatase: 43 U/L (ref 38–126)
Anion gap: 9 (ref 5–15)
BUN: 13 mg/dL (ref 6–20)
CO2: 27 mmol/L (ref 22–32)
Calcium: 8.5 mg/dL — ABNORMAL LOW (ref 8.9–10.3)
Chloride: 103 mmol/L (ref 98–111)
Creatinine, Ser: 0.53 mg/dL (ref 0.44–1.00)
GFR calc Af Amer: >60 mL/min (ref >60)
GFR calc non Af Amer: >60 mL/min (ref >60)
Glucose, Bld: 109 mg/dL — ABNORMAL HIGH (ref 70–99)
Potassium: 3.9 mmol/L (ref 3.5–5.1)
Sodium: 139 mmol/L (ref 135–145)
Total Bilirubin: 0.4 mg/dL (ref 0.3–1.2)
Total Protein: 6.1 g/dL — ABNORMAL LOW (ref 6.5–8.1)

## 2019-12-23 LAB — D-DIMER, QUANTITATIVE: D-Dimer, Quant: 0.28 ug/mL-FEU (ref 0.00–0.50)

## 2019-12-23 LAB — C-REACTIVE PROTEIN: CRP: 1 mg/dL — ABNORMAL HIGH (ref <1.0)

## 2019-12-23 LAB — FERRITIN: Ferritin: 433 ng/mL — ABNORMAL HIGH (ref 11–307)

## 2019-12-23 MED ORDER — DEXAMETHASONE 6 MG PO TABS: 6.0000 mg | ORAL_TABLET | Freq: Every day | ORAL | 0 refills | Status: AC

## 2019-12-23 MED ORDER — ALBUTEROL SULFATE HFA 108 (90 BASE) MCG/ACT IN AERS: 1.0000 | INHALATION_SPRAY | RESPIRATORY_TRACT | 0 refills | Status: DC | PRN

## 2019-12-23 NOTE — Discharge Summary (Signed)
Physician Discharge Summary  Alexandra Coffey JJH:[id number] DOB: Jul 24, 1966   PCP: [name], MD  Admit date: 12/19/2019 Discharge date: 12/23/2019 Length of Stay: 3 days   Code Status: Full Code  Admitted From:  Home Discharged to:   [place]:  None  Equipment/Devices: O2 Discharge Condition:  Stable  Recommendations for Outpatient Follow-up   1. Follow up with PCP in 1 week 2. Follow up CMP, LFTs were elevated but improving 3. Reassess need for supplemental O2  Hospital Summary  This is a 54 year old female with history of COVID-19 recently diagnosed and GERD with 9-day history of flulike symptoms with positive Covid contact.  Fever at home 102F with severe fatigue.  Was not vaccinated against Covid previously.  Admitted for acute hypoxic respiratory failure due to COVID-19.   She completed 5 days of remdesivir and 5 of 10 days decadron. She had significant improvement in her respiratory status but desatted on ambulation requiring 2 L/min [place] prior to discharge. She was discharged with supplemental O2, Decadron x 5 days and an albuterol inhaler. Needs to follow up with PCP with CMP to follow up LFTs which were elevated due to COVID and improved but not resolved at DC.  A & P   Active Problems:   Esophageal reflux   Pneumonia due to COVID-19 virus   Dehydration   Intractable nausea and vomiting   Rhabdomyolysis   Acute respiratory failure with hypoxia (HCC)   Respiratory failure, acute ([place])    1. Acute hypoxic respiratory failures secondary to COVID-19 pneumonia a. Completed 5 days remdesivir, day 5/10 dexamethasone b. discharged with 5 days dexamethasone c. Tolerates RA at rest, needs 2L on ambulation at discharge  2. Hypotension secondary to volume depletion, resolved with IV fluids  3. Anxiety Continue home Xanax   4. GERD stable on home medication  5. Intractable nausea/vomiting resolved with as needed Zofran and fluids  6. Insomnia on  melatonin this admission   7. Thrombocytopenia probably from Covid resolved  8. Leukopenia likely from Covid resolved  9. Elevated LFTs secondary to COVID 19 1. Follow up [facility] outpatient     Consultants  . none  Procedures  . none  Antibiotics   Anti-infectives (From admission, onward)   Start     Dose/Rate Route Frequency Ordered Stop   12/20/19 1000  remdesivir 100 mg in sodium chloride 0.9 % 100 mL IVPB     100 mg 200 mL/hr over 30 Minutes Intravenous Daily 12/19/19 1708 12/23/19 0918   12/19/19 1745  remdesivir 100 mg in sodium chloride 0.9 % 100 mL IVPB     100 mg 200 mL/hr over 30 Minutes Intravenous  Once 12/19/19 1708 12/19/19 1841   12/19/19 1715  remdesivir 100 mg in sodium chloride 0.9 % 100 mL IVPB     100 mg 200 mL/hr over 30 Minutes Intravenous  Once 12/19/19 1708 12/19/19 1814       Subjective  Patient seen and examined sitting upright in chair without O2 on. no acute distress and resting comfortably.  No events overnight.  Tolerating diet. In good spirits and anticipating discharge.   Denies any chest pain, shortness of breath, fever, nausea, vomiting, urinary or bowel complaints. Otherwise ROS negative    Objective   Discharge Exam: Vitals:   12/22/19 2119 12/23/19 0538  BP: 116/66 95/60  Pulse: 73 62  Resp: 20 18  Temp: 97.8 F (36.6 C) 98.1 F (36.7 C)  SpO2: 94% 95%   Vitals:  12/22/19 1500 12/22/19 2119 12/23/19 0422 12/23/19 0538  BP:  116/66  95/60  Pulse:  73  62  Resp:  20  18  Temp:  97.8 F (36.6 C)  98.1 F (36.7 C)  TempSrc:  Oral  Oral  SpO2: 92% 94%  95%  Weight:   76.3 kg   Height:        Physical Exam Vitals and nursing note reviewed.  Constitutional:      Appearance: Normal appearance.  HENT:     Head: Normocephalic and atraumatic.  Eyes:     Conjunctiva/sclera: Conjunctivae normal.  Cardiovascular:     Rate and Rhythm: Normal rate and regular rhythm.  Pulmonary:     Effort: Pulmonary effort is normal.      Breath sounds: Normal breath sounds.     Comments: On RA No conversational dyspnea Abdominal:     General: Abdomen is flat.     Palpations: Abdomen is soft.  Musculoskeletal:        General: No swelling or tenderness.  Skin:    Coloration: Skin is not jaundiced or pale.  Neurological:     Mental Status: She is alert. Mental status is at baseline.  Psychiatric:        Mood and Affect: Mood normal.        Behavior: Behavior normal.       The results of significant diagnostics from this hospitalization (including imaging, microbiology, ancillary and laboratory) are listed below for reference.     Microbiology: Recent Results (from the past 240 hour(s))  SARS CORONAVIRUS 2 (TAT 6-24 HRS) Nasopharyngeal Nasopharyngeal Swab     Status: Abnormal   Collection Time: 12/15/19  9:33 AM   Specimen: Nasopharyngeal Swab  Result Value Ref Range Status   SARS Coronavirus 2 POSITIVE (A) NEGATIVE Final    Comment: RESULT CALLED TO, READ BACK BY AND VERIFIED WITH: EMAILED Cecille Rubin [facility] 2207 12/15/2019 T. TYSOR (NOTE) SARS-CoV-2 target nucleic acids are DETECTED. The SARS-CoV-2 RNA is generally detectable in upper and lower respiratory specimens during the acute phase of infection. Positive results are indicative of the presence of SARS-CoV-2 RNA. Clinical correlation with patient history and other diagnostic information is  necessary to determine patient infection status. Positive results do not rule out bacterial infection or co-infection with other viruses.  The expected result is Negative. Fact Sheet for Patients: SugarRoll.be Fact Sheet for Healthcare Providers: https://www.woods-mathews.com/ This test is not yet approved or cleared by the [place] FDA and  has been authorized for detection and/or diagnosis of SARS-CoV-2 by FDA under an Emergency Use Authorization (EUA). This EUA will remain  in effect (meaning this test can be used )  for the duration of the COVID-19 declaration under Section 564(b)(1) of the Act, 21 U.S.C. section 360bbb-3(b)(1), unless the authorization is terminated or revoked sooner. Performed at [facility], [place] [address], [place], [place] [address]   Blood Culture (routine x 2)     Status: None (Preliminary result)   Collection Time: 12/19/19  4:05 PM   Specimen: BLOOD  Result Value Ref Range Status   Specimen Description   Final    BLOOD RIGHT ANTECUBITAL Performed at [facility], [address] [place], [place] [address]    Special Requests   Final    BOTTLES DRAWN AEROBIC AND ANAEROBIC Blood Culture adequate volume Performed at [facility], [address], [place], [place] [address]    Culture   Final  NO GROWTH 4 DAYS Performed at [facility], [place] [address], [place], [place] [address]    Report Status PENDING  Incomplete  Blood Culture (routine x 2)     Status: None (Preliminary result)   Collection Time: 12/19/19  4:10 PM   Specimen: BLOOD  Result Value Ref Range Status   Specimen Description   Final    BLOOD LEFT ANTECUBITAL Performed at [facility], [address] [place], [place] [address]    Special Requests   Final    BOTTLES DRAWN AEROBIC AND ANAEROBIC Blood Culture adequate volume Performed at [facility], [address] [place], [place] [address]    Culture   Final    NO GROWTH 4 DAYS Performed at [facility], [place] [address], [place], [place] [address]    Report Status PENDING  Incomplete     Labs: BNP (last 3 results) No results for input(s): BNP in the last 8760 hours. Basic Metabolic Panel: Recent Labs  Lab 12/19/19 1605 12/19/19 2148 12/20/19 0508 12/21/19 0415 12/22/19 0424 12/23/19 0449  NA 133*  --  136 140 138 139  K 3.3*  --  4.4 4.1 3.8 3.9  CL 96*  --  105 108 102 103  CO2 27  --  '22 23 28 27  '$ GLUCOSE 100*  --  126* 113* 100* 109*  BUN 10  --  '11 11 12 13   '$ CREATININE 1.00  --  0.71 0.60 0.62 0.53  CALCIUM 8.0*  --  7.3* 8.0* 8.3* 8.5*  MG  --  2.0 2.2 2.3 2.4 2.3  PHOS  --   --  3.6 2.7 4.3 4.6   Liver Function Tests: Recent Labs  Lab 12/19/19 1605 12/20/19 0508 12/21/19 0415 12/22/19 0424 12/23/19 0449  AST 340* 267* 165* 137* 110*  ALT 216* 191* 158* 144* 140*  ALKPHOS 38 34* 32* 38 43  BILITOT 0.4 0.2* 0.5 0.6 0.4  PROT 6.3* 5.4* 5.5* 5.8* 6.1*  ALBUMIN 3.3* 2.8* 2.7* 3.0* 3.1*   No results for input(s): LIPASE, AMYLASE in the last 168 hours. No results for input(s): AMMONIA in the last 168 hours. CBC: Recent Labs  Lab 12/19/19 1605 12/20/19 0508 12/21/19 0415 12/22/19 0424 12/23/19 0449  WBC 3.3* 1.9* 5.2 4.6 3.8*  NEUTROABS 2.1 1.1* 3.8 3.1 2.2  HGB 15.1* 14.0 14.5 14.1 14.5  HCT 45.3 43.5 45.4 43.4 44.5  MCV 91.9 94.0 94.8 91.9 91.2  PLT 97* 87* 142* 154 168   Cardiac Enzymes: Recent Labs  Lab 12/19/19 2148 12/20/19 0508  CKTOTAL 965* 818*   BNP: Invalid input(s): POCBNP CBG: Recent Labs  Lab 12/19/19 2135  GLUCAP 122*   D-Dimer Recent Labs    12/22/19 0424 12/23/19 0449  DDIMER <0.27 0.28   Hgb A1c No results for input(s): HGBA1C in the last 72 hours. Lipid Profile No results for input(s): CHOL, HDL, LDLCALC, TRIG, CHOLHDL, LDLDIRECT in the last 72 hours. Thyroid function studies No results for input(s): TSH, T4TOTAL, T3FREE, THYROIDAB in the last 72 hours.  Invalid input(s): FREET3 Anemia work up Recent Labs    12/22/19 0424 12/23/19 0449  FERRITIN 613* 433*   Urinalysis    Component Value Date/Time   COLORURINE YELLOW 12/20/2019 0039   APPEARANCEUR HAZY (A) 12/20/2019 0039   LABSPEC 1.028 12/20/2019 0039   PHURINE 5.0 12/20/2019 0039   GLUCOSEU NEGATIVE 12/20/2019 0039   HGBUR NEGATIVE 12/20/2019 0039   BILIRUBINUR NEGATIVE  12/20/2019 0039   KETONESUR 5 (A) 12/20/2019 0039   PROTEINUR 100 (A) 12/20/2019 0039   UROBILINOGEN 0.2 03/08/2012 1657   NITRITE NEGATIVE 12/20/2019  0039   LEUKOCYTESUR NEGATIVE 12/20/2019 0039   Sepsis Labs Invalid input(s): PROCALCITONIN,  WBC,  LACTICIDVEN Microbiology Recent Results (from the past 240 hour(s))  SARS CORONAVIRUS 2 (TAT 6-24 HRS) Nasopharyngeal Nasopharyngeal Swab     Status: Abnormal   Collection Time: 12/15/19  9:33 AM   Specimen: Nasopharyngeal Swab  Result Value Ref Range Status   SARS Coronavirus 2 POSITIVE (A) NEGATIVE Final    Comment: RESULT CALLED TO, READ BACK BY AND VERIFIED WITH: EMAILED Cecille Rubin [facility] 2207 12/15/2019 T. TYSOR (NOTE) SARS-CoV-2 target nucleic acids are DETECTED. The SARS-CoV-2 RNA is generally detectable in upper and lower respiratory specimens during the acute phase of infection. Positive results are indicative of the presence of SARS-CoV-2 RNA. Clinical correlation with patient history and other diagnostic information is  necessary to determine patient infection status. Positive results do not rule out bacterial infection or co-infection with other viruses.  The expected result is Negative. Fact Sheet for Patients: SugarRoll.be Fact Sheet for Healthcare Providers: https://www.woods-mathews.com/ This test is not yet approved or cleared by the [place] FDA and  has been authorized for detection and/or diagnosis of SARS-CoV-2 by FDA under an Emergency Use Authorization (EUA). This EUA will remain  in effect (meaning this test can be used ) for the duration of the COVID-19 declaration under Section 564(b)(1) of the Act, 21 U.S.C. section 360bbb-3(b)(1), unless the authorization is terminated or revoked sooner. Performed at [facility], [place] [address] [place], [place] [address]   Blood Culture (routine x 2)     Status: None (Preliminary result)   Collection Time: 12/19/19  4:05 PM   Specimen: BLOOD  Result Value Ref Range Status   Specimen Description   Final    BLOOD RIGHT ANTECUBITAL Performed at [facility], [address] [place], [place] [address]    Special Requests   Final    BOTTLES DRAWN AEROBIC AND ANAEROBIC Blood Culture adequate volume Performed at [facility], [address] [place], [place] [address]    Culture   Final    NO GROWTH 4 DAYS Performed at [facility], [place] [address], [place], [place] [address]    Report Status PENDING  Incomplete  Blood Culture (routine x 2)     Status: None (Preliminary result)   Collection Time: 12/19/19  4:10 PM   Specimen: BLOOD  Result Value Ref Range Status   Specimen Description   Final    BLOOD LEFT ANTECUBITAL Performed at [facility], [address] [place], [place] [address]    Special Requests   Final    BOTTLES DRAWN AEROBIC AND ANAEROBIC Blood Culture adequate volume Performed at [facility], [address] [place], [place] [address]    Culture   Final    NO GROWTH 4 DAYS Performed at [facility], [place] [address] [place], [place] [address]    Report Status PENDING  Incomplete    Discharge Instructions     Discharge Instructions    Diet - low sodium heart healthy   Complete by: As directed    Discharge instructions   Complete by: As directed    You were seen in the hospital for COVID-19.  Upon discharge: -Take Decadron 6 mg daily for the  next 5 days - You can use the Albuterol inhaler every four hours as needed for wheezing or shortness of breath - use 2L/min O2 via nasal canula on exertion until cleared by a doctor  - get lab work in one week  -You are still contagious with COVID-19 and should self isolate at home.  Isolation can be discontinued when the following criteria are met:  At least 10 days have passed since symptoms first appeared AND You are at least 1 day (24 hours) since resolution of fever without the use of any fever reducing medications (Tylenol, Motrin, ibuprofen, etc.) AND There is improvement in your symptoms (ex. shortness of breath, cough,  etc.)  If you have any questions do not hesitate to contact your primary care physician or return to the ED if worsening symptoms.   Increase activity slowly   Complete by: As directed    Increase activity slowly   Complete by: As directed      Allergies as of 12/23/2019      Reactions   Codeine    Makes me feel dizzy, drunk, disoriented   Sulfa Antibiotics Swelling   tongue      Medication List    TAKE these medications   albuterol 108 (90 Base) MCG/ACT inhaler Commonly known as: VENTOLIN HFA Inhale 1 puff into the lungs every 4 (four) hours as needed for wheezing or shortness of breath.   alprazolam 2 MG tablet Commonly known as: XANAX Take 2 mg by mouth at bedtime as needed for sleep.   dexamethasone 6 MG tablet Commonly known as: DECADRON Take 1 tablet (6 mg total) by mouth daily for 5 days. Start taking on: December 24, 2019   omeprazole 20 MG capsule Commonly known as: PRILOSEC TAKE 1 CAPSULE BY MOUTH EVERY DAY   ondansetron 4 MG disintegrating tablet Commonly known as: Zofran ODT Take 1 tablet (4 mg total) by mouth every 8 (eight) hours as needed for nausea or vomiting.            Durable Medical Equipment  (From admission, onward)         Start     Ordered   12/23/19 1125  DME Oxygen  Once    Comments: Tolerates RA at rest Requires 2L on ambulation  Question Answer Comment  Length of Need 6 Months   Mode or (Route) Nasal cannula   Liters per Minute 2   Frequency Continuous (stationary and portable oxygen unit needed)   Oxygen delivery system Gas      12/23/19 1128          Allergies  Allergen Reactions  . Codeine     Makes me feel dizzy, drunk, disoriented  . Sulfa Antibiotics Swelling    tongue    Dispo: The patient is from: Home              Anticipated d/c is to: Home              Anticipated d/c date is: today              Patient currently is medically stable to d/c.    Time coordinating discharge: Over 30 minutes    SIGNED:   Harold Hedge, D.O. [facility] Pager: [phone]  12/23/2019, 11:29 AM

## 2019-12-23 NOTE — Plan of Care (Signed)
  Problem: Clinical Measurements: Goal: Respiratory complications will improve Outcome: Adequate for Discharge   Problem: Activity: Goal: Risk for activity intolerance will decrease Outcome: Adequate for Discharge   Problem: Activity: Goal: Risk for activity intolerance will decrease Outcome: Adequate for Discharge

## 2019-12-23 NOTE — Progress Notes (Signed)
SATURATION QUALIFICATIONS: (This note is used to comply with regulatory documentation for home oxygen)  Patient Saturations on Room Air at Rest = 90%  Patient Saturations on Room Air while Ambulating = 88%  Patient Saturations on 2 Liters of oxygen while Ambulating = 92%  Please briefly explain why patient needs home oxygen:

## 2019-12-23 NOTE — TOC Progression Note (Signed)
Transition of Care [facility]) - Progression Note    Patient Details  Name: Alexandra Coffey MRN: [id number] Date of Birth: 05/09/1966  Transition of Care [facility]) CM/SW Contact  Joaquin Courts, RN Phone Number: 12/23/2019, 12:20 PM  Clinical Narrative:   [facility] to deliver oxygen to bedside.    Expected Discharge Plan: Home/Self Care Barriers to Discharge: No Barriers Identified  Expected Discharge Plan and Services Expected Discharge Plan: Home/Self Care       Living arrangements for the past 2 months: Single Family Home Expected Discharge Date: 12/23/19               DME Arranged: Oxygen DME Agency: [employer] Date DME Agency Contacted: 12/23/19 Time DME Agency Contacted: 1219 Representative spoke with at DME Agency: [place] ([place]) Interventions    Readmission Risk Interventions No flowsheet data found.

## 2019-12-24 LAB — CULTURE, BLOOD (ROUTINE X 2)
Culture: NO GROWTH
Culture: NO GROWTH
Special Requests: ADEQUATE
Special Requests: ADEQUATE

## 2020-01-19 IMAGING — CT CT FOOT*L* W/O CM
2 series · 12 of 27 positions shown, 15 images · non-contrast
Comparison: None.

CLINICAL DATA: Nonhealing foot fracture. Initial injury April 2018. Surgery 06/30/2018.

EXAM:
CT OF THE LEFT FOOT WITHOUT CONTRAST
TECHNIQUE: Multidetector CT imaging of the left foot was performed according to
the standard protocol. Multiplanar CT image reconstructions were
also generated.

[Series 4: soft tissue lower extremity · axial · 0.50mm/px · z∈[-344,-212]mm · 7 of 80 slices shown, 9 images]
[im 7/80  soft-tissue]
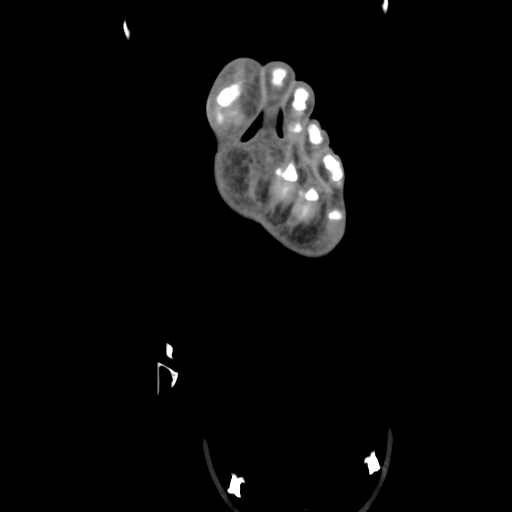
[im 7/80  bone]
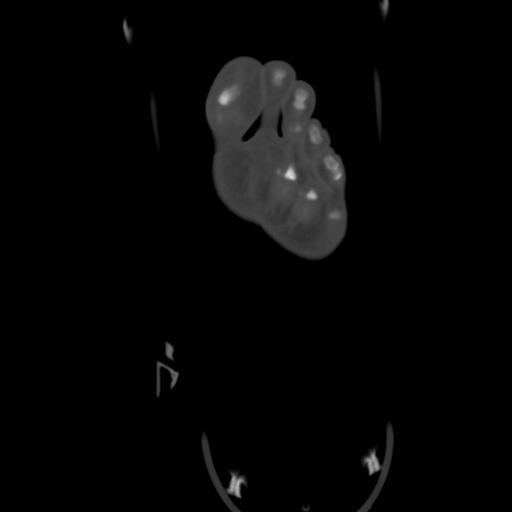
[im 19/80  bone]
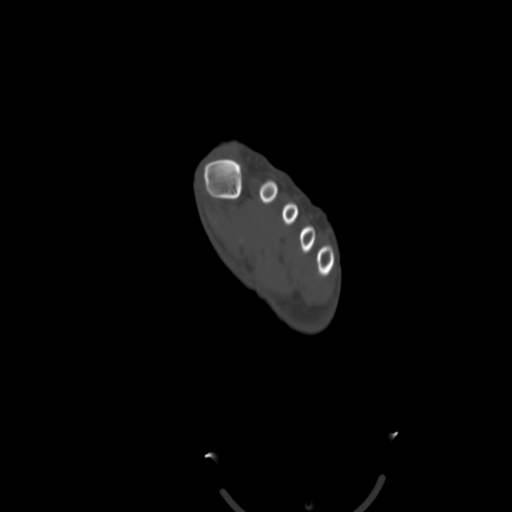
[im 31/80  bone]
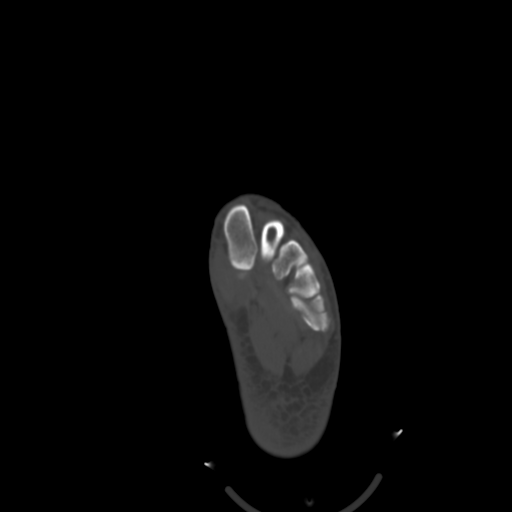
[im 43/80  bone]
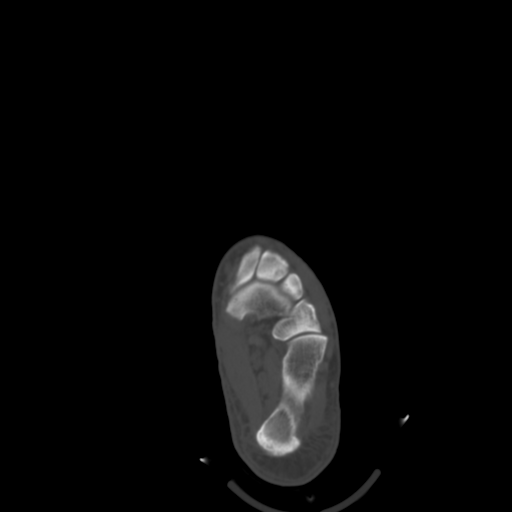
[im 49/80  soft-tissue]
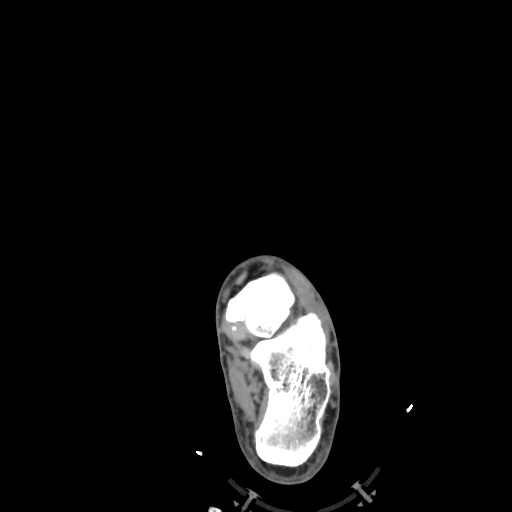
[im 49/80  bone]
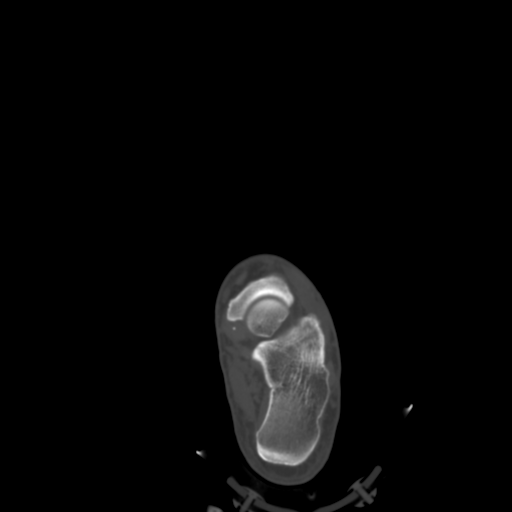
[im 61/80  bone]
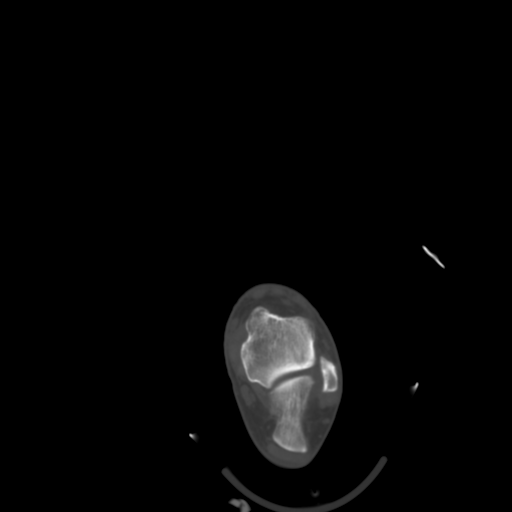
[im 73/80  bone]
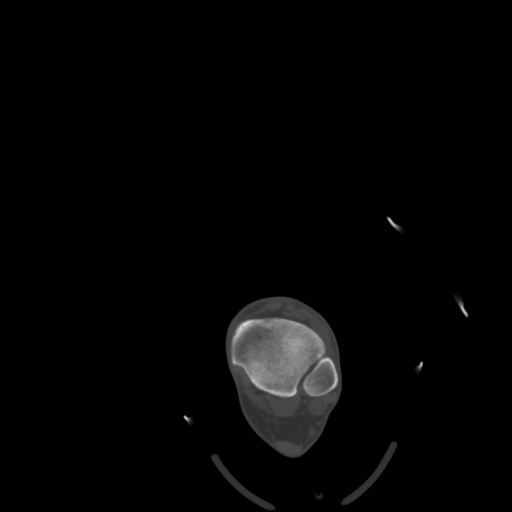

[Series 13: sagsoft (id) · sagittal · 0.26mm/px · 5 of 45 slices shown, 6 images]
[im 15/45  bone]
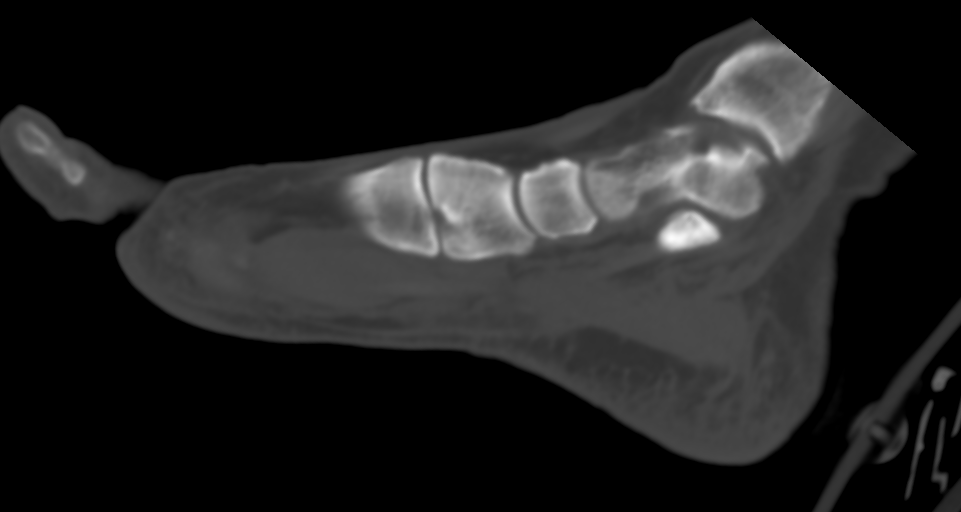
[im 19/45  bone]
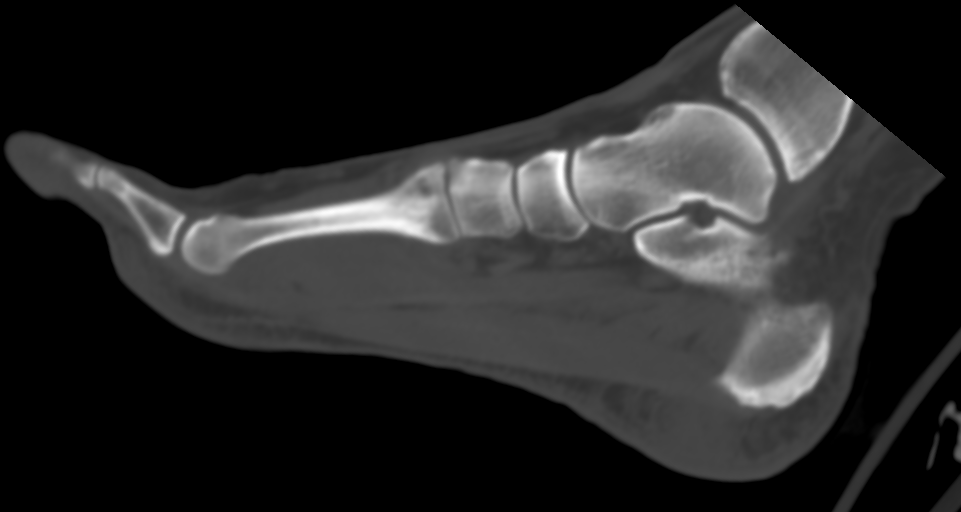
[im 23/45  soft-tissue]
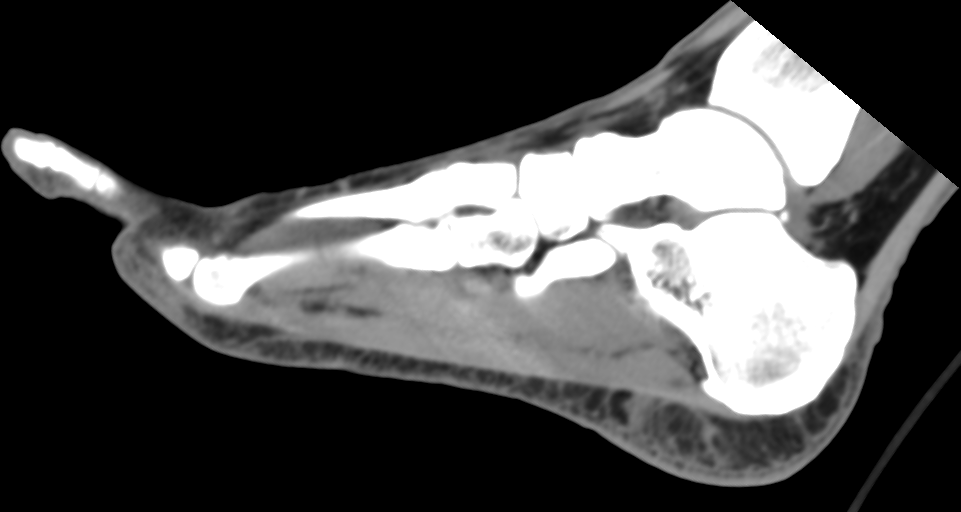
[im 23/45  bone]
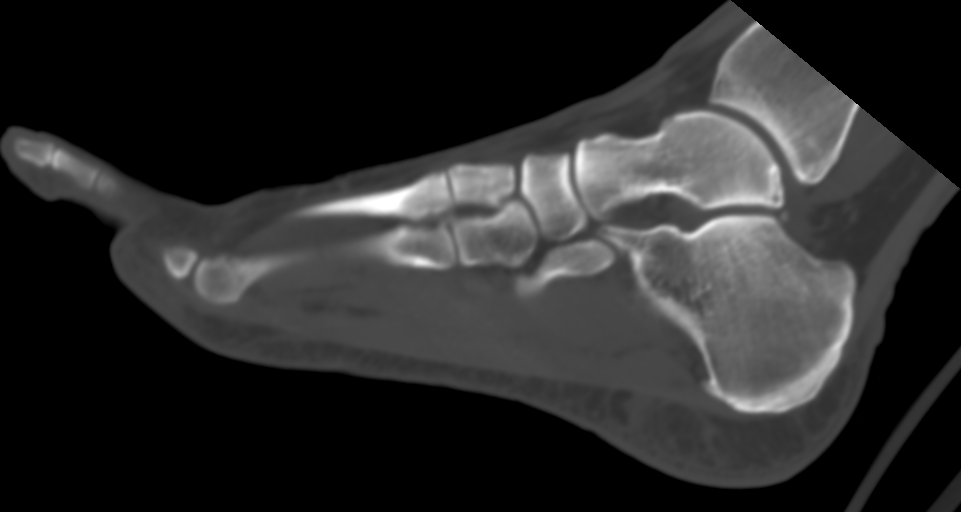
[im 26/45  bone]
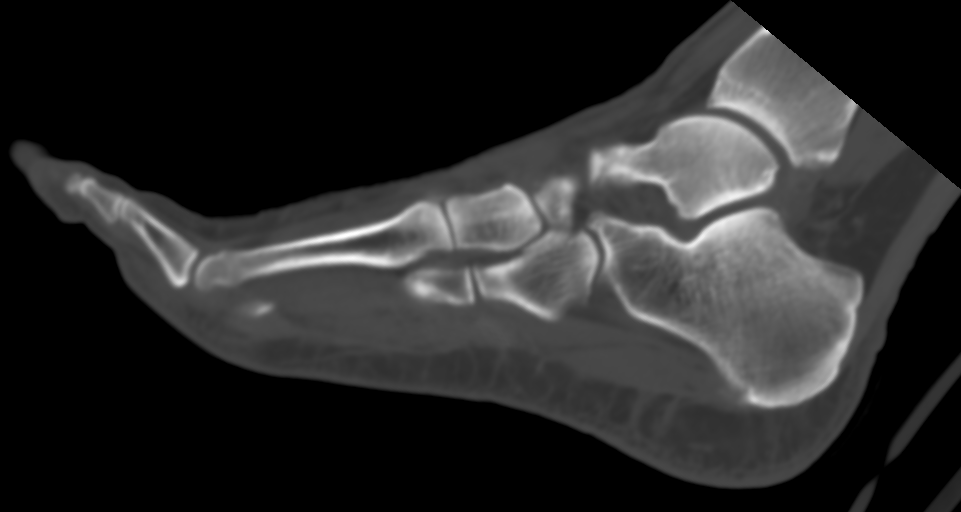
[im 30/45  bone]
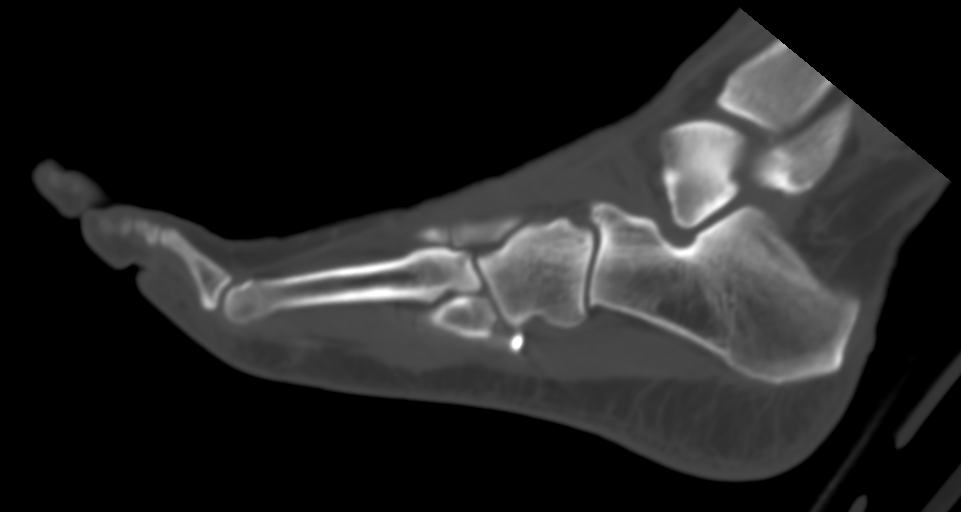

[12 of 27 positions shown; findings below may reference images not displayed]

FINDINGS: There is an ununited transverse fracture through the base of the
fifth metatarsal. I do not see any osseous bridging or callus
formation. The edges are smooth and corticated. There is a single
fixating screw noted in place.

No acute bony findings. Mild degenerative changes are noted. The
soft tissues are unremarkable.
IMPRESSION: Ununited transverse fracture through the base of the fifth
metatarsal with a single fixating screw in place.

## 2020-03-17 ENCOUNTER — Encounter: Payer: Self-pay | Admitting: Obstetrics & Gynecology

## 2020-06-26 ENCOUNTER — Encounter: Payer: BLUE CROSS/BLUE SHIELD | Admitting: Obstetrics & Gynecology

## 2020-09-03 ENCOUNTER — Encounter: Payer: Self-pay | Admitting: Obstetrics & Gynecology

## 2020-09-03 ENCOUNTER — Other Ambulatory Visit: Payer: Self-pay

## 2020-09-03 ENCOUNTER — Ambulatory Visit (INDEPENDENT_AMBULATORY_CARE_PROVIDER_SITE_OTHER): Payer: BC Managed Care – PPO | Admitting: Obstetrics & Gynecology

## 2020-09-03 VITALS — BP 126/82 | Ht 65.0 in | Wt 188.0 lb

## 2020-09-03 DIAGNOSIS — Z01419 Encounter for gynecological examination (general) (routine) without abnormal findings: Secondary | ICD-10-CM | POA: Diagnosis not present

## 2020-09-03 DIAGNOSIS — Z6831 Body mass index (BMI) 31.0-31.9, adult: Secondary | ICD-10-CM

## 2020-09-03 DIAGNOSIS — Z79890 Hormone replacement therapy (postmenopausal): Secondary | ICD-10-CM

## 2020-09-03 DIAGNOSIS — E669 Obesity, unspecified: Secondary | ICD-10-CM | POA: Diagnosis not present

## 2020-09-03 NOTE — Progress Notes (Signed)
Alexandra Coffey 08-21-1966 [id number]   History:    54 y.o. G2P2L2 Married  Daughter 88 yo h/o anorexia/Bipolar disorder.  RP:  Established patient presenting for annual gyn exam   HPI: On Estrogen/Testosterone Pellets and Progesterone. No PMB.  No pelvic pain.  No pain with IC anymore. Foot fracture post minor fall, 2 surgeries in 2019.  Breast augmentation, no breast pain/lump.  Urine and bowel movements normal.  Body mass index increased to 31.28.  Had Covid in 12/2019.  Health labs with family physician.  Colonoscopy in 2016.  Past medical history,surgical history, family history and social history were all reviewed and documented in the EPIC chart.  Gynecologic History Patient's last menstrual period was 03/11/2014.  Obstetric History OB History  Gravida Para Term Preterm AB Living  2 2 2     2   SAB IAB Ectopic Multiple Live Births          2    # Outcome Date GA Lbr Len/2nd Weight Sex Delivery Anes PTL Lv  2 Term     F CS-Unspec  N LIV  1 Term     F CS-Unspec  N LIV     ROS: A ROS was performed and pertinent positives and negatives are included in the history.  GENERAL: No fevers or chills. HEENT: No change in vision, no earache, sore throat or sinus congestion. NECK: No pain or stiffness. CARDIOVASCULAR: No chest pain or pressure. No palpitations. PULMONARY: No shortness of breath, cough or wheeze. GASTROINTESTINAL: No abdominal pain, nausea, vomiting or diarrhea, melena or bright red blood per rectum. GENITOURINARY: No urinary frequency, urgency, hesitancy or dysuria. MUSCULOSKELETAL: No joint or muscle pain, no back pain, no recent trauma. DERMATOLOGIC: No rash, no itching, no lesions. ENDOCRINE: No polyuria, polydipsia, no heat or cold intolerance. No recent change in weight. HEMATOLOGICAL: No anemia or easy bruising or bleeding. NEUROLOGIC: No headache, seizures, numbness, tingling or weakness. PSYCHIATRIC: No depression, no loss of interest in normal activity or change in  sleep pattern.     Exam:   BP 126/82   Ht 5\' 5"  (1.651 m)   Wt 188 lb (85.3 kg)   LMP 03/11/2014   BMI 31.28 kg/m   Body mass index is 31.28 kg/m.  General appearance : Well developed well nourished female. No acute distress HEENT: Eyes: no retinal hemorrhage or exudates,  Neck supple, trachea midline, no carotid bruits, no thyroidmegaly Lungs: Clear to auscultation, no rhonchi or wheezes, or rib retractions  Heart: Regular rate and rhythm, no murmurs or gallops Breast:Examined in sitting and supine position were symmetrical in appearance, no palpable masses or tenderness,  no skin retraction, no nipple inversion, no nipple discharge, no skin discoloration, no axillary or supraclavicular lymphadenopathy Abdomen: no palpable masses or tenderness, no rebound or guarding Extremities: no edema or skin discoloration or tenderness  Pelvic: Vulva: Normal             Vagina: No gross lesions or discharge  Cervix: No gross lesions or discharge  Uterus  AV, normal volume, mobile, NT  Adnexa  Without masses or tenderness  Anus: Normla   Assessment/Plan:  54 y.o. female for annual exam   1. Well female exam with routine gynecological exam Normal gynecologic exam in menopause.  Pap test in 2019 was negative, will repeat at 3 years.  Breast exam status post bilateral augmentation was normal.  Screening mammogram July 2021 was negative.  Colonoscopy 2016.  Health labs with family physician.  2. Postmenopausal  hormone replacement therapy Followed by integrative medicine with estradiol and testosterone pellets.  Also on progesterone.  Patient cautioned against high hormone replacement therapy which has not been studied in terms of risks of stroke and risks of breast cancer.  3. Class 1 obesity without serious comorbidity with body mass index (BMI) of 31.0 to 31.9 in adult, unspecified obesity type Patient has a healthy nutrition and is physically active on a regular basis.  Other orders -  UNABLE TO FIND; PELLETS- ESTROGEN, TESTOSTERONE AND PROGESTERONE  Princess Bruins MD, 2:24 PM 09/03/2020

## 2021-03-23 ENCOUNTER — Encounter: Payer: Self-pay | Admitting: Obstetrics & Gynecology

## 2021-09-04 ENCOUNTER — Ambulatory Visit: Payer: Self-pay | Admitting: Obstetrics & Gynecology

## 2021-09-11 ENCOUNTER — Other Ambulatory Visit (HOSPITAL_COMMUNITY)
Admission: RE | Admit: 2021-09-11 | Discharge: 2021-09-11 | Disposition: A | Discharge status: Home / Self Care | Payer: BC Managed Care – PPO | Source: Ambulatory Visit | Attending: Obstetrics & Gynecology | Admitting: Obstetrics & Gynecology

## 2021-09-11 ENCOUNTER — Ambulatory Visit (INDEPENDENT_AMBULATORY_CARE_PROVIDER_SITE_OTHER): Payer: BC Managed Care – PPO | Admitting: Obstetrics & Gynecology

## 2021-09-11 ENCOUNTER — Encounter: Payer: Self-pay | Admitting: Obstetrics & Gynecology

## 2021-09-11 ENCOUNTER — Other Ambulatory Visit: Payer: Self-pay

## 2021-09-11 VITALS — BP 120/80 | HR 71 | Resp 16 | Ht 65.25 in | Wt 187.0 lb

## 2021-09-11 DIAGNOSIS — Z79890 Hormone replacement therapy (postmenopausal): Secondary | ICD-10-CM

## 2021-09-11 DIAGNOSIS — Z01419 Encounter for gynecological examination (general) (routine) without abnormal findings: Secondary | ICD-10-CM

## 2021-09-11 NOTE — Progress Notes (Signed)
Alexandra Coffey October 21, 1965 [id number]   History:    56 y.o.  G2P2L2 Married.  Patient sings in a Rock band.  Daughter 81 yo h/o anorexia/Bipolar disorder.   RP:  Established patient presenting for annual gyn exam    HPI: On Estrogen/Testosterone Pellets and Progesterone. No PMB.  No pelvic pain.  No pain with IC anymore. Pap 06/2018 Neg.  Foot fracture post minor fall, 2 surgeries in 2019.  Breast augmentation, no breast pain/lump.  Screen Mammo 03/2021 Neg.  Urine and bowel movements normal.  Body mass index 30.88, high muscle mass.  Health labs with family physician.  BMD normal in 06-27-18. COLONOSCOPY: 07-11-15, 10 yr schedule.  Past medical history,surgical history, family history and social history were all reviewed and documented in the EPIC chart.  Gynecologic History Patient's last menstrual period was 03/11/2014.  Obstetric History OB History  Gravida Para Term Preterm AB Living  2 2 2     2   SAB IAB Ectopic Multiple Live Births          2    # Outcome Date GA Lbr Len/2nd Weight Sex Delivery Anes PTL Lv  2 Term     F CS-Unspec  N LIV  1 Term     F CS-Unspec  N LIV     ROS: A ROS was performed and pertinent positives and negatives are included in the history.  GENERAL: No fevers or chills. HEENT: No change in vision, no earache, sore throat or sinus congestion. NECK: No pain or stiffness. CARDIOVASCULAR: No chest pain or pressure. No palpitations. PULMONARY: No shortness of breath, cough or wheeze. GASTROINTESTINAL: No abdominal pain, nausea, vomiting or diarrhea, melena or bright red blood per rectum. GENITOURINARY: No urinary frequency, urgency, hesitancy or dysuria. MUSCULOSKELETAL: No joint or muscle pain, no back pain, no recent trauma. DERMATOLOGIC: No rash, no itching, no lesions. ENDOCRINE: No polyuria, polydipsia, no heat or cold intolerance. No recent change in weight. HEMATOLOGICAL: No anemia or easy bruising or bleeding. NEUROLOGIC: No headache, seizures, numbness,  tingling or weakness. PSYCHIATRIC: No depression, no loss of interest in normal activity or change in sleep pattern.     Exam:   BP 120/80    Pulse 71    Resp 16    Ht 5' 5.25" (1.657 m)    Wt 187 lb (84.8 kg)    LMP 03/11/2014    BMI 30.88 kg/m   Body mass index is 30.88 kg/m.  General appearance : Well developed well nourished female. No acute distress HEENT: Eyes: no retinal hemorrhage or exudates,  Neck supple, trachea midline, no carotid bruits, no thyroidmegaly Lungs: Clear to auscultation, no rhonchi or wheezes, or rib retractions  Heart: Regular rate and rhythm, no murmurs or gallops Breast:Examined in sitting and supine position were symmetrical in appearance, no palpable masses or tenderness,  no skin retraction, no nipple inversion, no nipple discharge, no skin discoloration, no axillary or supraclavicular lymphadenopathy Abdomen: no palpable masses or tenderness, no rebound or guarding Extremities: no edema or skin discoloration or tenderness  Pelvic: Vulva: Normal             Vagina: No gross lesions or discharge  Cervix: No gross lesions or discharge.  Pap reflex done.  Uterus  AV, normal size, shape and consistency, non-tender and mobile  Adnexa  Without masses or tenderness  Anus: Normal   Assessment/Plan:  56 y.o. female for annual exam   1. Encounter for routine gynecological examination with Papanicolaou smear of  cervix On Estrogen/Testosterone Pellets and Progesterone. No PMB.  No pelvic pain.  No pain with IC anymore. Pap 06/2018 Neg.  Foot fracture post minor fall, 2 surgeries in 2019.  Breast augmentation, no breast pain/lump.  Screen Mammo 03/2021 Neg.  Urine and bowel movements normal.  Body mass index 30.88, high muscle mass.  Health labs with family physician.  BMD normal in 06-27-18. COLONOSCOPY: 07-11-15, 10 yr schedule. - Cytology - PAP( [place])  2. Postmenopausal hormone replacement therapy On Estrogen/Testosterone Pellets and Progesterone. No PMB.   No pelvic pain. BMD normal in 06-27-18.  Other orders - omeprazole (PRILOSEC) 40 MG capsule; Take 40 mg by mouth daily as needed. - progesterone (PROMETRIUM) 200 MG capsule; Take by mouth at bedtime. - valACYclovir (VALTREX) 1000 MG tablet; Take 1,000 mg by mouth 2 (two) times daily as needed. - ALPRAZolam (XANAX) 1 MG tablet; Take 1 mg by mouth at bedtime as needed for anxiety.   Princess Bruins MD, 10:42 AM 09/11/2021

## 2021-09-14 LAB — CYTOLOGY - PAP: Diagnosis: NEGATIVE

## 2022-03-29 ENCOUNTER — Encounter: Payer: Self-pay | Admitting: Obstetrics & Gynecology

## 2022-09-16 ENCOUNTER — Encounter: Payer: Self-pay | Admitting: Obstetrics & Gynecology

## 2022-09-16 ENCOUNTER — Ambulatory Visit (INDEPENDENT_AMBULATORY_CARE_PROVIDER_SITE_OTHER): Payer: BC Managed Care – PPO | Admitting: Obstetrics & Gynecology

## 2022-09-16 VITALS — BP 114/80 | HR 69 | Ht 65.25 in | Wt 194.0 lb

## 2022-09-16 DIAGNOSIS — Z01419 Encounter for gynecological examination (general) (routine) without abnormal findings: Secondary | ICD-10-CM | POA: Diagnosis not present

## 2022-09-16 DIAGNOSIS — Z79890 Hormone replacement therapy (postmenopausal): Secondary | ICD-10-CM | POA: Diagnosis not present

## 2022-09-16 NOTE — Progress Notes (Signed)
Carizma [facility] 1966-07-19 [id number]   History:    57 y.o. G2P2L2 Married.  Patient sings in a Rock band.  Daughter 59 yo h/o anorexia/Bipolar disorder.  Has a 59 yo Geographical information systems officer.   RP:  Established patient presenting for annual gyn exam    HPI: On Estrogen/Testosterone Pellets and Progesterone. No PMB.  No pelvic pain.  No pain with IC anymore. Pap 09/2021 Neg. Breast augmentation, no breast pain/lump.  Screen Mammo 03/2022 Neg.  Urine and bowel movements normal.  Body mass index 32.04, high muscle mass.  Health labs with family physician.  BMD normal in 06-27-18.   COLONOSCOPY: 07-11-15, 10 yr schedule. Flu vaccine declined.   Past medical history,surgical history, family history and social history were all reviewed and documented in the EPIC chart.  Gynecologic History Patient's last menstrual period was 03/11/2014.  Obstetric History OB History  Gravida Para Term Preterm AB Living  '2 2 2     2  '$ SAB IAB Ectopic Multiple Live Births          2    # Outcome Date GA Lbr Len/2nd Weight Sex Delivery Anes PTL Lv  2 Term     F CS-Unspec  N LIV  1 Term     F CS-Unspec  N LIV     ROS: A ROS was performed and pertinent positives and negatives are included in the history. GENERAL: No fevers or chills. HEENT: No change in vision, no earache, sore throat or sinus congestion. NECK: No pain or stiffness. CARDIOVASCULAR: No chest pain or pressure. No palpitations. PULMONARY: No shortness of breath, cough or wheeze. GASTROINTESTINAL: No abdominal pain, nausea, vomiting or diarrhea, melena or bright red blood per rectum. GENITOURINARY: No urinary frequency, urgency, hesitancy or dysuria. MUSCULOSKELETAL: No joint or muscle pain, no back pain, no recent trauma. DERMATOLOGIC: No rash, no itching, no lesions. ENDOCRINE: No polyuria, polydipsia, no heat or cold intolerance. No recent change in weight. HEMATOLOGICAL: No anemia or easy bruising or bleeding. NEUROLOGIC: No headache, seizures, numbness, tingling  or weakness. PSYCHIATRIC: No depression, no loss of interest in normal activity or change in sleep pattern.     Exam:   BP 114/80   Pulse 69   Ht 5' 5.25" (1.657 m)   Wt 194 lb (88 kg)   LMP 03/11/2014   SpO2 96%   BMI 32.04 kg/m   Body mass index is 32.04 kg/m.  General appearance : Well developed well nourished female. No acute distress HEENT: Eyes: no retinal hemorrhage or exudates,  Neck supple, trachea midline, no carotid bruits, no thyroidmegaly Lungs: Clear to auscultation, no rhonchi or wheezes, or rib retractions  Heart: Regular rate and rhythm, no murmurs or gallops Breast:Examined in sitting and supine position were symmetrical in appearance s/p bilateral breast implants, no palpable masses or tenderness,  no skin retraction, no nipple inversion, no nipple discharge, no skin discoloration, no axillary or supraclavicular lymphadenopathy Abdomen: no palpable masses or tenderness, no rebound or guarding Extremities: no edema or skin discoloration or tenderness  Pelvic: Vulva: Normal             Vagina: No gross lesions or discharge  Cervix: No gross lesions or discharge  Uterus  AV, normal size, shape and consistency, non-tender and mobile  Adnexa  Without masses or tenderness  Anus: Normal   Assessment/Plan:  57 y.o. female for annual exam   1. Well female exam with routine gynecological exam On Estrogen/Testosterone Pellets and Progesterone. No PMB.  No pelvic  pain.  No pain with IC anymore. Pap 09/2021 Neg. Breast augmentation, no breast pain/lump.  Screen Mammo 03/2022 Neg.  Urine and bowel movements normal.  Body mass index 32.04, high muscle mass.  Health labs with family physician.  BMD normal in 06-27-18.   COLONOSCOPY: 07-11-15, 10 yr schedule. Flu vaccine declined   2. Postmenopausal hormone replacement therapy Continues with Estrogen/Testo pellets and Progesterone with [facility].  Risks of excessive Estrogen/Testo discussed with patient previously.  No  PMB.  No pelvic pain.  Other orders - Multiple Vitamins-Minerals (ZINC PO); Take by mouth. - Cholecalciferol (VITAMIN D-3 PO); Take by mouth. - Ascorbic Acid (VITAMIN C PO); Take by mouth.   Princess Bruins MD, 9:45 AM

## 2023-10-05 ENCOUNTER — Ambulatory Visit (INDEPENDENT_AMBULATORY_CARE_PROVIDER_SITE_OTHER): Payer: BC Managed Care – PPO | Admitting: Obstetrics and Gynecology

## 2023-10-05 ENCOUNTER — Encounter: Payer: Self-pay | Admitting: Obstetrics and Gynecology

## 2023-10-05 VITALS — BP 114/74 | HR 70 | Ht 64.75 in | Wt 165.0 lb

## 2023-10-05 DIAGNOSIS — Z01419 Encounter for gynecological examination (general) (routine) without abnormal findings: Secondary | ICD-10-CM

## 2023-10-05 DIAGNOSIS — E2839 Other primary ovarian failure: Secondary | ICD-10-CM

## 2023-10-05 DIAGNOSIS — Z1331 Encounter for screening for depression: Secondary | ICD-10-CM | POA: Diagnosis not present

## 2023-10-05 DIAGNOSIS — D219 Benign neoplasm of connective and other soft tissue, unspecified: Secondary | ICD-10-CM

## 2023-10-05 NOTE — Progress Notes (Signed)
58 y.o. y.o. female here for annual exam. Patient's last menstrual period was 03/11/2014.    G2P2L2 Married.  Patient sings in a Rock band.  Daughter 20 yo h/o anorexia/Bipolar disorder.  Has a 60 yo Engineer, maintenance (IT).   RP:  Established patient presenting for annual gyn exam    HPI: On Estrogen/Testosterone Pellets and Progesterone. No PMB.  No pelvic pain.  No pain with IC anymore. Pap 09/2021 Neg. Breast augmentation, no breast pain/lump.  Screen Mammo 03/2022 Neg.  Urine and bowel movements normal.  Body mass index 32.04, high muscle mass.  Health labs with family physician.  BMD normal in 06-27-18.   COLONOSCOPY: 07-11-15, 10 yr schedule. Flu vaccine declined.  Body mass index is 27.67 kg/m.     10/05/2023    9:20 AM  Depression screen PHQ 2/9  Decreased Interest 0  Down, Depressed, Hopeless 0  PHQ - 2 Score 0    Height 5' 4.75" (1.645 m), weight 165 lb (74.8 kg), last menstrual period 03/11/2014.     Component Value Date/Time   DIAGPAP  09/11/2021 1109    - Negative for intraepithelial lesion or malignancy (NILM)   ADEQPAP  09/11/2021 1109    Satisfactory for evaluation; transformation zone component PRESENT.    GYN HISTORY:    Component Value Date/Time   DIAGPAP  09/11/2021 1109    - Negative for intraepithelial lesion or malignancy (NILM)   ADEQPAP  09/11/2021 1109    Satisfactory for evaluation; transformation zone component PRESENT.    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     Ascorbic Acid (VITAMIN C PO) Take by mouth.     Cholecalciferol (VITAMIN D-3 PO) Take by mouth.     Multiple Vitamins-Minerals (ZINC PO) Take by mouth.     omeprazole (PRILOSEC) 40 MG capsule Take 40 mg by mouth daily as needed.     progesterone (PROMETRIUM) 200 MG capsule Take by mouth at bedtime.     UNABLE TO FIND PELLETS- ESTROGEN, TESTOSTERONE AND PROGESTERONE     valACYclovir (VALTREX) 1000 MG tablet Take 1,000 mg by mouth 2 (two) times daily as needed.     No current facility-administered medications on file prior to visit.    Social History   Socioeconomic History   Marital status: Married    Spouse  name: Not on file   Number of children: 2   Years of education: Not on file   Highest education level: Not on file  Occupational History   Occupation: Sales  Tobacco Use   Smoking status: Former    Current packs/day: 0.00    Types: Cigarettes    Quit date: 03/09/2007    Years since quitting: 16.5   Smokeless tobacco: Never   Tobacco comments:    As a teenager  Vaping Use   Vaping status: Never Used  Substance and Sexual Activity   Alcohol use: No    Alcohol/week: 0.0 standard drinks of alcohol   Drug use: No   Sexual activity: Yes    Partners: Male    Birth control/protection: Post-menopausal  Other Topics Concern   Not on file  Social History Narrative   Not on file   Social Drivers of Health   Financial Resource Strain: Not on file  Food Insecurity: Low Risk  (03/12/2023)   Received from [facility]   Hunger Vital Sign    Worried About Running Out of Food in the Last Year: Never true    Ran Out of Food in the Last  Year: Never true  Transportation Needs: Not on file (03/12/2023)  Physical Activity: Not on file  Stress: Not on file  Social Connections: Not on file  Intimate Partner Violence: Not on file    Family History  Problem Relation Age of Onset   Diabetes Mother    Osteoporosis Mother    Hyperlipidemia Mother    Dementia Mother    Cancer Father        esophageal      Allergies  Allergen Reactions   Codeine     Makes me feel dizzy, drunk, disoriented   Sulfa Antibiotics Swelling    tongue      Patient's last menstrual period was Patient's last menstrual period was 03/11/2014.Marland Kitchen            Review of Systems Alls systems reviewed and are negative.     Physical Exam Constitutional:      Appearance: Normal appearance.  Genitourinary:     Vulva and urethral meatus normal.     No lesions in the vagina.     Genitourinary Comments: Dense uterus on bimanual ?fibroids to get tv US to evaluate further     Right Labia: No rash, lesions or skin changes.    Left Labia: No lesions, skin changes or rash.    No vaginal discharge or tenderness.     No vaginal prolapse present.    No vaginal atrophy present.     Right Adnexa: not tender, not palpable and no mass present.    Left Adnexa: not tender, not palpable and no mass present.    No cervical motion tenderness or discharge.     Uterus is enlarged and irregular.     Uterus is not tender.  Breasts:    Right: Normal.     Left: Normal.  HENT:     Head: Normocephalic.  Neck:     Thyroid: No thyroid mass, thyromegaly or thyroid tenderness.  Cardiovascular:     Rate and Rhythm: Normal rate and regular rhythm.     Heart sounds: Normal heart sounds, S1 normal and S2 normal.  Pulmonary:     Effort: Pulmonary effort is normal.     Breath sounds: Normal breath sounds and air entry.  Abdominal:     General: There is no distension.  Palpations: Abdomen is soft. There is no mass.     Tenderness: There is no abdominal tenderness.  There is no guarding or rebound.  Musculoskeletal:        General: Normal range of motion.     Cervical back: Full passive range of motion without pain, normal range of motion and neck supple. No tenderness.     Right lower leg: No edema.     Left lower leg: No edema.  Neurological:     Mental Status: She is alert.  Skin:    General: Skin is warm.  Psychiatric:        Mood and Affect: Mood normal.        Behavior: Behavior normal.        Thought Content: Thought content normal.  Vitals and nursing note reviewed. Exam conducted with a chaperone present.       A:         Well Woman GYN exam                             P:        Pap smear not indicated Encouraged annual mammogram screening Colon cancer screening up-to-date DXA ordered today Labs and immunizations to do with PMD Discussed breast self exams Encouraged healthy lifestyle practices Encouraged Vit D and Calcium  RTC for PUS to further evaluate uterus and ovaries No follow-ups on file.  Earley Favor

## 2023-11-10 ENCOUNTER — Ambulatory Visit: Payer: BC Managed Care – PPO

## 2023-11-10 ENCOUNTER — Ambulatory Visit: Payer: BC Managed Care – PPO | Admitting: Obstetrics and Gynecology

## 2023-11-10 ENCOUNTER — Encounter: Payer: Self-pay | Admitting: Obstetrics and Gynecology

## 2023-11-10 VITALS — BP 134/82 | HR 63

## 2023-11-10 DIAGNOSIS — Z01419 Encounter for gynecological examination (general) (routine) without abnormal findings: Secondary | ICD-10-CM

## 2023-11-10 DIAGNOSIS — E2839 Other primary ovarian failure: Secondary | ICD-10-CM | POA: Diagnosis not present

## 2023-11-10 DIAGNOSIS — M810 Age-related osteoporosis without current pathological fracture: Secondary | ICD-10-CM | POA: Diagnosis not present

## 2023-11-10 DIAGNOSIS — D219 Benign neoplasm of connective and other soft tissue, unspecified: Secondary | ICD-10-CM

## 2023-11-10 DIAGNOSIS — Z712 Person consulting for explanation of examination or test findings: Secondary | ICD-10-CM

## 2023-11-10 DIAGNOSIS — Z87311 Personal history of (healed) other pathological fracture: Secondary | ICD-10-CM

## 2023-11-10 NOTE — Progress Notes (Signed)
 58 y.o. y.o. female here for PUS results. Patient's last menstrual period was 03/11/2014.    G2P2L2 Married.  Patient sings in a Rock band.  Daughter 12 yo h/o anorexia/Bipolar disorder.  Has a 60 yo Engineer, maintenance (IT).   RP:  Established patient presenting for annual gyn exam    HPI: On Estrogen/Testosterone Pellets and Progesterone. No PMB.  No pelvic pain.  No pain with IC anymore. Pap 09/2021 Neg. Breast augmentation, no breast pain/lump.  Screen Mammo 03/2022 Neg.  Urine and bowel movements normal.  Body mass index 32.04, high muscle mass.  Health labs with family physician.  BMD normal in 06-27-18. Fragility fracture 2019 broke 3 bones in her foot while walking.  Needs repeat bone scan. Scheduled her out in August. Needs it sooner.  Mother with osteoporosis.  She is lifting weight and taking calcium and vit D. COLONOSCOPY: 07-11-15, 10 yr schedule. Flu vaccine declined.  There is no height or weight on file to calculate BMI.     10/05/2023    9:20 AM  Depression screen PHQ 2/9  Decreased Interest 0  Down, Depressed, Hopeless 0  PHQ - 2 Score 0    Blood pressure 134/82, pulse 63, last menstrual period 03/11/2014, SpO2 98%.     Component Value Date/Time   DIAGPAP  09/11/2021 1109    - Negative for intraepithelial lesion or malignancy (NILM)   ADEQPAP  09/11/2021 1109    Satisfactory for evaluation; transformation zone component PRESENT.    GYN HISTORY:    Component Value Date/Time   DIAGPAP  09/11/2021 1109    - Negative for intraepithelial lesion or malignancy (NILM)   ADEQPAP  09/11/2021 1109    Satisfactory for evaluation; transformation zone component PRESENT.    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     Ascorbic Acid (VITAMIN C PO) Take by mouth.     Cholecalciferol (VITAMIN D-3 PO) Take by mouth.     furosemide (LASIX) 20 MG tablet Take 20 mg by mouth daily as needed.     Multiple Vitamins-Minerals (ZINC PO) Take by mouth.     omeprazole (PRILOSEC) 40 MG capsule Take 40 mg by mouth daily as needed.     progesterone (PROMETRIUM) 200 MG capsule Take by mouth at bedtime.     UNABLE TO FIND  PELLETS- ESTROGEN, TESTOSTERONE AND PROGESTERONE     valACYclovir (VALTREX) 1000 MG tablet Take 1,000 mg by mouth 2 (two) times daily as needed.     No current facility-administered medications on file prior to visit.    Social History   Socioeconomic History   Marital status: Married    Spouse name: Not on file   Number of children: 2   Years of education: Not on file   Highest education level: Not on file  Occupational History   Occupation: Sales  Tobacco Use   Smoking status: Former    Current packs/day: 0.00    Types: Cigarettes    Quit date: 03/09/2007    Years since quitting: 16.6   Smokeless tobacco: Never   Tobacco comments:    As a teenager  Vaping Use   Vaping status: Never Used  Substance and Sexual Activity   Alcohol use: No    Alcohol/week: 0.0 standard drinks of alcohol   Drug use: No   Sexual activity: Yes    Partners: Male    Birth control/protection: Post-menopausal  Other Topics Concern   Not on file   Social History Narrative   Not on file   Social Drivers of Health   Financial Resource Strain: Not on file  Food Insecurity: Low Risk  (03/12/2023)   Received from [facility]   Hunger Vital Sign    Worried About Running Out of Food in the Last Year: Never true    Ran Out of Food in the Last Year: Never true  Transportation Needs: Not on file (03/12/2023)  Physical Activity: Not on file  Stress: Not on file  Social Connections: Not on file  Intimate Partner Violence: Not on file    Family History  Problem Relation Age of Onset   Diabetes Mother    Osteoporosis Mother    Hyperlipidemia Mother    Dementia Mother    Cancer Father        esophageal      Allergies  Allergen Reactions   Codeine     Makes me feel dizzy, drunk, disoriented   Sulfa Antibiotics Swelling    tongue      Patient's last menstrual period was Patient's last menstrual period was 03/11/2014.Marland Kitchen            PUS results  11.44 cm anteverted uterus Enlarged with multiple intramural, subserosal, and ?submucosal fibroids Size range from 2.37 to 4.02cm Six fibroids seen and measured  Endometrial lining 3.20mm  Both ovaries seen and WNL  No adnexal masses No free fluid    A:         PUS results                              P:         PUS results reviewed and discussed with patient.  She is asymptomatic at this time frame and has no bleeding.  To monitor every 1-2 years to make sure they are stable in size. Referral placed for sooner DXA than August considering fragility fracture. She is leaning towards conservative care, if possible, but would be willing to discuss options, if she needs medication.  No follow-ups on file.  15 minutes spent on reviewing records, imaging,  and one on one patient time and counseling patient and documentation Dr. Judith Blonder

## 2024-05-24 ENCOUNTER — Other Ambulatory Visit: Payer: BC Managed Care – PPO

## 2024-10-08 ENCOUNTER — Ambulatory Visit: Admitting: Obstetrics and Gynecology

## 2024-11-12 ENCOUNTER — Ambulatory Visit: Admitting: Obstetrics and Gynecology
